# Patient Record
Sex: Female | Born: 1980
Health system: Southern US, Community
[De-identification: ages and names within clinical notes are randomized; demographics above are authoritative.]

## PROBLEM LIST (undated history)

## (undated) DIAGNOSIS — R7303 Prediabetes: Secondary | ICD-10-CM

## (undated) DIAGNOSIS — I493 Ventricular premature depolarization: Secondary | ICD-10-CM

## (undated) DIAGNOSIS — G43909 Migraine, unspecified, not intractable, without status migrainosus: Secondary | ICD-10-CM

## (undated) DIAGNOSIS — K760 Fatty (change of) liver, not elsewhere classified: Secondary | ICD-10-CM

## (undated) DIAGNOSIS — I10 Essential (primary) hypertension: Secondary | ICD-10-CM

## (undated) DIAGNOSIS — K743 Primary biliary cirrhosis: Secondary | ICD-10-CM

## (undated) DIAGNOSIS — M1990 Unspecified osteoarthritis, unspecified site: Secondary | ICD-10-CM

## (undated) HISTORY — DX: Prediabetes: R73.03

## (undated) HISTORY — DX: Primary biliary cirrhosis: K74.3

## (undated) HISTORY — DX: Migraine, unspecified, not intractable, without status migrainosus: G43.909

## (undated) HISTORY — DX: Fatty (change of) liver, not elsewhere classified: K76.0

## (undated) HISTORY — DX: Unspecified osteoarthritis, unspecified site: M19.90

## (undated) HISTORY — DX: Ventricular premature depolarization: I49.3

---

## 2001-02-27 ENCOUNTER — Other Ambulatory Visit: Admission: RE | Admit: 2001-02-27 | Discharge: 2001-02-27 | Payer: Self-pay | Admitting: Obstetrics and Gynecology

## 2001-08-29 ENCOUNTER — Inpatient Hospital Stay (HOSPITAL_COMMUNITY): Admission: AD | Admit: 2001-08-29 | Discharge: 2001-08-31 | Payer: Self-pay | Admitting: Obstetrics and Gynecology

## 2002-07-21 ENCOUNTER — Other Ambulatory Visit: Admission: RE | Admit: 2002-07-21 | Discharge: 2002-07-21 | Payer: Self-pay | Admitting: Obstetrics and Gynecology

## 2002-11-22 ENCOUNTER — Inpatient Hospital Stay (HOSPITAL_COMMUNITY): Admission: AD | Admit: 2002-11-22 | Discharge: 2002-11-22 | Payer: Self-pay | Admitting: Obstetrics & Gynecology

## 2002-11-23 ENCOUNTER — Inpatient Hospital Stay (HOSPITAL_COMMUNITY): Admission: AD | Admit: 2002-11-23 | Discharge: 2002-11-26 | Payer: Self-pay | Admitting: Obstetrics and Gynecology

## 2006-09-03 ENCOUNTER — Inpatient Hospital Stay (HOSPITAL_COMMUNITY): Admission: AD | Admit: 2006-09-03 | Discharge: 2006-09-03 | Payer: Self-pay | Admitting: Obstetrics & Gynecology

## 2006-09-03 HISTORY — PX: TUBAL LIGATION: SHX77

## 2006-09-16 ENCOUNTER — Inpatient Hospital Stay (HOSPITAL_COMMUNITY): Admission: AD | Admit: 2006-09-16 | Discharge: 2006-09-18 | Payer: Self-pay | Admitting: Obstetrics & Gynecology

## 2011-11-07 ENCOUNTER — Ambulatory Visit: Payer: Self-pay | Admitting: Internal Medicine

## 2011-11-07 DIAGNOSIS — Z0289 Encounter for other administrative examinations: Secondary | ICD-10-CM

## 2012-12-02 ENCOUNTER — Other Ambulatory Visit (HOSPITAL_COMMUNITY)
Admission: RE | Admit: 2012-12-02 | Discharge: 2012-12-02 | Disposition: A | Discharge status: Home / Self Care | Payer: 59 | Source: Ambulatory Visit | Attending: Family Medicine | Admitting: Family Medicine

## 2012-12-02 ENCOUNTER — Other Ambulatory Visit: Payer: Self-pay | Admitting: Family Medicine

## 2012-12-02 DIAGNOSIS — Z01419 Encounter for gynecological examination (general) (routine) without abnormal findings: Secondary | ICD-10-CM | POA: Insufficient documentation

## 2013-05-21 ENCOUNTER — Emergency Department (HOSPITAL_COMMUNITY)
Admission: EM | Admit: 2013-05-21 | Discharge: 2013-05-21 | Disposition: A | Discharge status: Home / Self Care | Payer: No Typology Code available for payment source | Attending: Emergency Medicine | Admitting: Emergency Medicine

## 2013-05-21 ENCOUNTER — Encounter (HOSPITAL_COMMUNITY): Payer: Self-pay | Admitting: Emergency Medicine

## 2013-05-21 ENCOUNTER — Emergency Department (HOSPITAL_COMMUNITY): Payer: No Typology Code available for payment source

## 2013-05-21 DIAGNOSIS — Y9389 Activity, other specified: Secondary | ICD-10-CM | POA: Insufficient documentation

## 2013-05-21 DIAGNOSIS — M25512 Pain in left shoulder: Secondary | ICD-10-CM

## 2013-05-21 DIAGNOSIS — Z79899 Other long term (current) drug therapy: Secondary | ICD-10-CM | POA: Insufficient documentation

## 2013-05-21 DIAGNOSIS — S46909A Unspecified injury of unspecified muscle, fascia and tendon at shoulder and upper arm level, unspecified arm, initial encounter: Secondary | ICD-10-CM | POA: Insufficient documentation

## 2013-05-21 DIAGNOSIS — M79622 Pain in left upper arm: Secondary | ICD-10-CM

## 2013-05-21 DIAGNOSIS — Y92410 Unspecified street and highway as the place of occurrence of the external cause: Secondary | ICD-10-CM | POA: Insufficient documentation

## 2013-05-21 DIAGNOSIS — I10 Essential (primary) hypertension: Secondary | ICD-10-CM | POA: Insufficient documentation

## 2013-05-21 DIAGNOSIS — S4980XA Other specified injuries of shoulder and upper arm, unspecified arm, initial encounter: Secondary | ICD-10-CM | POA: Insufficient documentation

## 2013-05-21 HISTORY — DX: Essential (primary) hypertension: I10

## 2013-05-21 MED ORDER — IBUPROFEN 600 MG PO TABS: 600.0000 mg | ORAL_TABLET | Freq: Four times a day (QID) | ORAL | Status: DC | PRN

## 2013-05-21 MED ORDER — DIAZEPAM 5 MG PO TABS: 5.0000 mg | ORAL_TABLET | Freq: Two times a day (BID) | ORAL | Status: DC
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
## 2013-05-24 NOTE — ED Provider Notes (Signed)
History/physical exam/procedure(s) were performed by non-physician practitioner and as supervising physician I was immediately available for consultation/collaboration. I have reviewed all notes and am in agreement with care and plan.   Hilario Quarry, MD 05/24/13 1500

## 2015-07-18 ENCOUNTER — Encounter (INDEPENDENT_AMBULATORY_CARE_PROVIDER_SITE_OTHER): Payer: Self-pay

## 2015-07-18 ENCOUNTER — Ambulatory Visit (INDEPENDENT_AMBULATORY_CARE_PROVIDER_SITE_OTHER): Payer: 59 | Admitting: Family

## 2015-07-18 ENCOUNTER — Encounter: Payer: Self-pay | Admitting: Family

## 2015-07-18 VITALS — BP 117/82 | HR 100 | Resp 16 | Ht 62.0 in | Wt 151.2 lb

## 2015-07-18 DIAGNOSIS — G43809 Other migraine, not intractable, without status migrainosus: Secondary | ICD-10-CM

## 2015-07-18 DIAGNOSIS — G43909 Migraine, unspecified, not intractable, without status migrainosus: Secondary | ICD-10-CM | POA: Insufficient documentation

## 2015-07-18 DIAGNOSIS — I10 Essential (primary) hypertension: Secondary | ICD-10-CM | POA: Insufficient documentation

## 2015-07-18 DIAGNOSIS — E785 Hyperlipidemia, unspecified: Secondary | ICD-10-CM

## 2015-07-18 MED ORDER — AMITRIPTYLINE HCL 10 MG PO TABS: 10.0000 mg | ORAL_TABLET | Freq: Every day | ORAL | Status: DC

## 2015-07-18 MED ORDER — SUMATRIPTAN SUCCINATE 50 MG PO TABS: ORAL_TABLET | ORAL | Status: DC

## 2015-07-18 NOTE — Assessment & Plan Note (Signed)
Tolerating statin. Plan to obtain flp next visit.

## 2015-07-18 NOTE — Patient Instructions (Signed)
Stop Excedrin migraine. Start elavil once daily at bedtime. You may use imitrex as needed for migraine (max 2 tabs in 24 hours).

## 2015-07-18 NOTE — Assessment & Plan Note (Signed)
Uncontrolled.  Advised pt as follows:  Stop Excedrin migraine. Start elavil once daily at bedtime. You may use imitrex as needed for migraine (max 2 tabs in 24 hours).

## 2015-07-18 NOTE — Progress Notes (Signed)
Pre visit review using our clinic review tool, if applicable. No additional management support is needed unless otherwise documented below in the visit note. 

## 2015-07-18 NOTE — Assessment & Plan Note (Signed)
BP Readings from Last 3 Encounters:  07/18/15 117/82  05/21/13 116/86   BP stable on current meds.  Plan bmet next visit.

## 2015-07-18 NOTE — Progress Notes (Signed)
Subjective:    Patient ID: Alyssa Berry, female    DOB: [birth year], 34 y.o.   MRN: [id number]  HPI   Ms. Alyssa Berry is a 34 yr old female who presents today to establish care. She presents with chief complaint of headache. Pmhx is significant for migraine. Reports that excedrin helps some but ends up taking 4 tabs in < 8 hr but HA still lingers. She reports HA 3-4 times a week.  Lasts 1 day. Denies nausea but does develop photophobia.  + phonophobia. Generally 8-9/10.  Reports minimal caffiene intake.  She has followed at [facility] with Darral Dash.    Pmhx is also significant for the following:   HTN- currently maintained on amlodipine, hctz, and diovan.    Hyperlipidemia- on lipitor.  Reports that her cholesterol was fair on lipitor.  Was increased 6 months ago from  to .    Arthritis-  Review of Systems  Constitutional: Negative for unexpected weight change.  HENT: Negative for hearing loss and rhinorrhea.   Eyes: Negative for visual disturbance.  Respiratory: Negative for cough and shortness of breath.   Cardiovascular: Negative for chest pain and leg swelling.  Gastrointestinal: Negative for nausea, vomiting and diarrhea.  Genitourinary: Negative for dysuria, frequency and menstrual problem.  Musculoskeletal:       Reports mild arthritis pain in left knee due to an old injury  Skin: Negative for rash.  Neurological: Positive for headaches.  Hematological: Negative for adenopathy.  Psychiatric/Behavioral:       Denies depression/anxiety     Past Medical History  Diagnosis Date  . Hypertension   . Arthritis   . Hyperlipidemia   . Migraine     Social History   Social History  . Marital Status: Married    Spouse Name: N/A  . Number of Children: N/A  . Years of Education: N/A   Occupational History  . Not on file.   Social History Main Topics  . Smoking status: Never Smoker   . Smokeless tobacco: Not on file  . Alcohol Use: No  . Drug Use: Not on file    . Sexual Activity: Not on file   Other Topics Concern  . 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 Tubal ligation  2008    Family History  Problem Relation Age of Onset  . Hyperlipidemia Mother   . Hypertension Mother   . Cancer Maternal Grandmother     lung  . Hyperlipidemia Maternal Grandmother   . Hypertension Maternal Grandmother   . Hyperlipidemia Maternal Grandfather     No Known Allergies  Current Outpatient Prescriptions on File Prior to Visit  Medication Sig Dispense Refill  . ibuprofen (ADVIL,MOTRIN) 600 MG tablet Take 1 tablet (600 mg total) by mouth every 6 (six) hours as needed for pain. 30 tablet 0   No current facility-administered medications on file prior to visit.    BP 117/82 mmHg  Pulse 100  Resp 16  Ht  (1.575 m)  SpO2 100%     Objective:   Physical Exam  Physical Exam  Constitutional: She is oriented to person, place, and time. She appears well-developed and well-nourished. No distress.  HENT:  Head: Normocephalic and atraumatic.  Right Ear: Tympanic membrane and ear canal normal.  Left Ear: Tympanic membrane and ear canal normal.  Mouth/Throat: Oropharynx is clear and moist.  Eyes: Pupils are equal, round, and reactive to light. No scleral icterus.  Neck: Normal range of motion. No thyromegaly present.  Cardiovascular: Normal rate and regular rhythm.   No murmur heard. Pulmonary/Chest: Effort normal and breath sounds normal. No respiratory distress. He has no wheezes. She has no rales. She exhibits no tenderness.  Abdominal: Soft. Bowel sounds are normal. He exhibits no distension and no mass. There is no tenderness. There is no rebound and no guarding.  Musculoskeletal: She exhibits no edema.  Lymphadenopathy:    She  has no cervical adenopathy.  Neurological: She is alert and oriented to person, place, and time. She has normal patellar reflexes. She exhibits normal muscle tone. Coordination normal.  Skin: Skin is warm and dry.  Psychiatric: She has a normal mood and affect. Her behavior is normal. Judgment and thought content normal.       Assessment & Plan:         Assessment & Plan:

## 2015-08-17 ENCOUNTER — Ambulatory Visit: Payer: Self-pay | Admitting: Family

## 2015-08-18 ENCOUNTER — Encounter: Payer: Self-pay | Admitting: Family

## 2015-08-19 MED ORDER — VALSARTAN 160 MG PO TABS: 160.0000 mg | ORAL_TABLET | Freq: Every day | ORAL | Status: DC

## 2015-08-19 MED ORDER — HYDROCHLOROTHIAZIDE 12.5 MG PO CAPS
12.5000 mg | ORAL_CAPSULE | Freq: Every day | ORAL | Status: DC
Start: 2015-08-19 — End: 2016-02-28

## 2015-08-19 MED ORDER — AMLODIPINE BESYLATE 5 MG PO TABS: 5.0000 mg | ORAL_TABLET | Freq: Every day | ORAL | Status: DC

## 2015-09-05 DIAGNOSIS — H5213 Myopia, bilateral: Secondary | ICD-10-CM | POA: Diagnosis not present

## 2015-09-07 ENCOUNTER — Ambulatory Visit (INDEPENDENT_AMBULATORY_CARE_PROVIDER_SITE_OTHER): Payer: 59 | Admitting: Family

## 2015-09-07 ENCOUNTER — Encounter: Payer: Self-pay | Admitting: Family

## 2015-09-07 VITALS — BP 109/77 | HR 91 | Temp 98.5°F | Resp 16 | Ht 62.0 in | Wt 152.4 lb

## 2015-09-07 DIAGNOSIS — E785 Hyperlipidemia, unspecified: Secondary | ICD-10-CM | POA: Diagnosis not present

## 2015-09-07 DIAGNOSIS — G43809 Other migraine, not intractable, without status migrainosus: Secondary | ICD-10-CM

## 2015-09-07 DIAGNOSIS — I10 Essential (primary) hypertension: Secondary | ICD-10-CM | POA: Diagnosis not present

## 2015-09-07 LAB — LIPID PANEL
CHOL/HDL RATIO: 4
Cholesterol: 163 mg/dL (ref 0–200)
HDL: 44.6 mg/dL (ref >39.00)
LDL CALC: 91 mg/dL (ref 0–99)
NonHDL: 118.71
TRIGLYCERIDES: 139 mg/dL (ref 0.0–149.0)
VLDL: 27.8 mg/dL (ref 0.0–40.0)

## 2015-09-07 LAB — BASIC METABOLIC PANEL
BUN: 9 mg/dL (ref 6–23)
CHLORIDE: 101 meq/L (ref 96–112)
CO2: 27 meq/L (ref 19–32)
CREATININE: 0.59 mg/dL (ref 0.40–1.20)
Calcium: 8.8 mg/dL (ref 8.4–10.5)
GFR: 123.44 mL/min (ref >60.00)
Glucose, Bld: 98 mg/dL (ref 70–99)
Potassium: 3.4 mEq/L — ABNORMAL LOW (ref 3.5–5.1)
Sodium: 139 mEq/L (ref 135–145)

## 2015-09-07 MED ORDER — AMITRIPTYLINE HCL 10 MG PO TABS: 10.0000 mg | ORAL_TABLET | Freq: Every day | ORAL | Status: DC

## 2015-09-07 NOTE — Progress Notes (Signed)
Subjective:    Patient ID: Alyssa Berry, female    DOB: 1981-04-21, 35 y.o.   MRN: [id number]  HPI  Alyssa Berry is a 35 yr old female who presents today for follow up.  1) Migraines-  Last visit she was instructed to d/c excedrin, start elavil.  Use imitrex prn.  She reports 3-4 migraines a week previously,  now down to 3-4 this last month.  Has used imitrex twice.    2) HTN- continues amlodipine and diovan.  BP Readings from Last 3 Encounters:  09/07/15 109/77  07/18/15 117/82  05/21/13 116/86   3)  Hyperlipidemia- maintained on statin.    Review of Systems    see HPI  Past Medical History  Diagnosis Date  . Hypertension   . Arthritis   . Hyperlipidemia   . Migraine     Social History   Social History  . Marital Status: Married    Spouse Name: N/A  . Number of Children: N/A  . Years of Education: N/A   Occupational History  . Not on file.   Social History Main Topics  . Smoking status: Never Smoker   . Smokeless tobacco: Not on file  . Alcohol Use: No  . Drug Use: Not on file  . Sexual Activity: Not on file   Other Topics Concern  . Not on file   Social History Narrative   Married   3 Children   2002- daughter Otho KetKaityln   2004- Cathryn   2008Alphonzo Lemmings- Vegeta   C[facility] at [employer]   Completed associates degree   Enjoys drawing    Past Surgical History  Procedure Laterality Date  . Tubal ligation  2008    Family History  Problem Relation Age of Onset  . Hyperlipidemia Mother   . Hypertension Mother   . Cancer Maternal Grandmother     lung  . Hyperlipidemia Maternal Grandmother   . Hypertension Maternal Grandmother   . Hyperlipidemia Maternal Grandfather     No Known Allergies  Current Outpatient Prescriptions on File Prior to Visit  Medication Sig Dispense Refill  . amitriptyline (ELAVIL) 10 MG tablet Take 1 tablet (10 mg total) by mouth at bedtime. 30 tablet 2  . amLODipine (NORVASC) 5 MG tablet Take 1 tablet (5 mg total) by mouth  daily. 90 tablet 1  . atorvastatin (LIPITOR) 40 MG tablet Take 40 mg by mouth daily.  0  . hydrochlorothiazide (MICROZIDE) 12.5 MG capsule Take 1 capsule (12.5 mg total) by mouth daily. 90 capsule 1  . ibuprofen (ADVIL,MOTRIN) 600 MG tablet Take 1 tablet (600 mg total) by mouth every 6 (six) hours as needed for pain. 30 tablet 0  . SUMAtriptan (IMITREX) 50 MG tablet 1 tab at start of migraine may repeat in 2 hours if needed.  Max 2 tabs/24 hours 10 tablet 5  . valsartan (DIOVAN) 160 MG tablet Take 1 tablet (160 mg total) by mouth daily. 90 tablet 1   No current facility-administered medications on file prior to visit.    BP 109/77 mmHg  Pulse 91  Temp(Src) 98.5 F (36.9 C) (Oral)  Resp 16  Ht 5\' 2"  (1.575 m)  Wt 152 lb 6.4 oz (69.128 kg)  BMI 27.87 kg/m2  SpO2 100%  LMP 07/18/2015    Objective:   Physical Exam  Constitutional: She is oriented to person, place, and time. She appears well-developed and well-nourished.  HENT:  Head: Normocephalic and atraumatic.  Cardiovascular: Normal rate, regular rhythm and normal heart  sounds.   No murmur heard. Pulmonary/Chest: Effort normal and breath sounds normal. No respiratory distress. She has no wheezes.  Musculoskeletal: She exhibits no edema.  Neurological: She is alert and oriented to person, place, and time.  Psychiatric: She has a normal mood and affect. Her behavior is normal. Judgment and thought content normal.          Assessment & Plan:

## 2015-09-07 NOTE — Assessment & Plan Note (Signed)
BP stable, obtain bmet.  

## 2015-09-07 NOTE — Patient Instructions (Addendum)
Please complete lab work prior to leaving. Schedule a complete physical at your convenience.  

## 2015-09-07 NOTE — Progress Notes (Signed)
Pre visit review using our clinic review tool, if applicable. No additional management support is needed unless otherwise documented below in the visit note. 

## 2015-09-07 NOTE — Assessment & Plan Note (Signed)
Tolerating statin, obtain lipid panel.  

## 2015-09-07 NOTE — Assessment & Plan Note (Signed)
Frequency of migraines has improved. Continue elavil and prn imitrex.

## 2015-09-08 ENCOUNTER — Telehealth: Payer: Self-pay | Admitting: Family

## 2015-09-08 DIAGNOSIS — E876 Hypokalemia: Secondary | ICD-10-CM

## 2015-09-08 MED ORDER — POTASSIUM CHLORIDE CRYS ER 20 MEQ PO TBCR: 20.0000 meq | EXTENDED_RELEASE_TABLET | Freq: Every day | ORAL | Status: DC

## 2015-09-08 MED FILL — KLOR-CON M20 TABLET: 20 | 30 days supply | Qty: 30 | Fill #0

## 2015-09-08 NOTE — Telephone Encounter (Signed)
Potassium is low.  Add kur once daily, repeat bmet in 1 week, dx hypokalemia.

## 2015-09-08 NOTE — Telephone Encounter (Signed)
Left a message for call back.  

## 2015-09-09 NOTE — Telephone Encounter (Signed)
Notified pt and she voices understanding. Lab order entered and lab appt scheduled [facility].

## 2015-09-16 ENCOUNTER — Other Ambulatory Visit (INDEPENDENT_AMBULATORY_CARE_PROVIDER_SITE_OTHER): Payer: 59

## 2015-09-16 ENCOUNTER — Encounter: Payer: Self-pay | Admitting: Family

## 2015-09-16 DIAGNOSIS — E876 Hypokalemia: Secondary | ICD-10-CM

## 2015-09-16 LAB — BASIC METABOLIC PANEL
BUN: 13 mg/dL (ref 6–23)
CO2: 27 meq/L (ref 19–32)
Calcium: 9.4 mg/dL (ref 8.4–10.5)
Chloride: 100 mEq/L (ref 96–112)
Creatinine, Ser: 0.6 mg/dL (ref 0.40–1.20)
GFR: 121.05 mL/min (ref >60.00)
GLUCOSE: 101 mg/dL — AB (ref 70–99)
POTASSIUM: 4 meq/L (ref 3.5–5.1)
SODIUM: 136 meq/L (ref 135–145)

## 2015-09-20 MED FILL — AMITRIPTYLINE HCL 10 MG TAB: 10 | 30 days supply | Qty: 30 | Fill #2

## 2015-09-28 ENCOUNTER — Encounter: Payer: Self-pay | Admitting: Family

## 2015-09-28 MED ORDER — ATORVASTATIN CALCIUM 40 MG PO TABS
40.0000 mg | ORAL_TABLET | Freq: Every day | ORAL | Status: DC
Start: 2015-09-28 — End: 2016-05-28

## 2015-09-28 MED FILL — ATORVASTATIN 40 MG TABLET: 40 | 90 days supply | Qty: 90 | Fill #0

## 2015-10-06 MED FILL — POTASSIUM CL ER 20 MEQ TAB: 20 | 30 days supply | Qty: 30 | Fill #1

## 2015-10-18 MED FILL — AMITRIPTYLINE HCL 10 MG TAB: 10 | 90 days supply | Qty: 90 | Fill #0

## 2015-11-27 MED FILL — VALSARTAN 160 MG TABLET: 160 | 90 days supply | Qty: 90 | Fill #1

## 2015-11-27 MED FILL — AMLODIPINE BESYLATE 5 MG TA: 5 | 90 days supply | Qty: 90 | Fill #1

## 2015-11-28 MED FILL — HYDROCHLOROTHIAZIDE 12.5 MG: 12.5 | 90 days supply | Qty: 90 | Fill #1

## 2015-11-28 MED FILL — KLOR-CON M20 TABLET: 20 | 30 days supply | Qty: 30 | Fill #2

## 2015-12-27 MED FILL — ATORVASTATIN 40 MG TABLET: 40 | 90 days supply | Qty: 90 | Fill #1

## 2015-12-27 MED FILL — KLOR-CON M20 TABLET: 20 | 30 days supply | Qty: 30 | Fill #3

## 2016-01-09 ENCOUNTER — Telehealth: Payer: Self-pay | Admitting: Behavioral Health

## 2016-01-09 NOTE — Telephone Encounter (Signed)
Unable to reach patient at time of Pre-Visit Call.  Left message for patient to return call when available.    

## 2016-01-10 ENCOUNTER — Telehealth: Payer: Self-pay | Admitting: Family

## 2016-01-11 ENCOUNTER — Encounter: Payer: 59 | Admitting: Family

## 2016-01-11 MED FILL — AMITRIPTYLINE HCL 10 MG TAB: 10 | 90 days supply | Qty: 90 | Fill #1

## 2016-01-16 ENCOUNTER — Encounter: Payer: Self-pay | Admitting: Family

## 2016-02-20 ENCOUNTER — Encounter: Payer: Self-pay | Admitting: Family

## 2016-02-22 ENCOUNTER — Encounter: Payer: Self-pay | Admitting: Family

## 2016-02-27 ENCOUNTER — Encounter: Payer: Self-pay | Admitting: Family

## 2016-02-28 MED ORDER — AMLODIPINE BESYLATE 5 MG PO TABS: 5.0000 mg | ORAL_TABLET | Freq: Every day | ORAL | Status: DC

## 2016-02-28 MED ORDER — VALSARTAN 160 MG PO TABS: 160.0000 mg | ORAL_TABLET | Freq: Every day | ORAL | Status: DC

## 2016-02-28 MED ORDER — HYDROCHLOROTHIAZIDE 12.5 MG PO CAPS: 12.5000 mg | ORAL_CAPSULE | Freq: Every day | ORAL | Status: DC

## 2016-02-28 MED FILL — SIMVASTATIN 40 MG TABLET: 40 | 90 days supply | Qty: 90 | Fill #0

## 2016-02-28 MED FILL — VALSARTAN 160 MG TABLET: 160 | 90 days supply | Qty: 90 | Fill #0

## 2016-02-28 MED FILL — AMLODIPINE BESYLATE 5 MG TA: 5 | 90 days supply | Qty: 90 | Fill #0

## 2016-02-28 MED FILL — SUMATRIPTAN SUCC 50 MG TAB: 50 | 30 days supply | Qty: 9 | Fill #1

## 2016-02-28 MED FILL — HYDROCHLOROTHIAZIDE 12.5 MG: 12.5 | 90 days supply | Qty: 90 | Fill #0

## 2016-03-05 ENCOUNTER — Encounter: Payer: Self-pay | Admitting: Family

## 2016-03-21 NOTE — Telephone Encounter (Signed)
Complete

## 2016-04-17 ENCOUNTER — Encounter: Payer: Self-pay | Admitting: Family

## 2016-04-18 MED ORDER — AMITRIPTYLINE HCL 10 MG PO TABS: 10.0000 mg | ORAL_TABLET | Freq: Every day | ORAL | 1 refills | Status: DC

## 2016-04-18 MED FILL — AMITRIPTYLINE HCL 10 MG TAB: 10 | 90 days supply | Qty: 90 | Fill #0

## 2016-05-08 ENCOUNTER — Encounter: Payer: Self-pay | Admitting: Family

## 2016-05-08 ENCOUNTER — Ambulatory Visit (INDEPENDENT_AMBULATORY_CARE_PROVIDER_SITE_OTHER): Payer: 59 | Admitting: Family

## 2016-05-08 VITALS — BP 111/82 | HR 83 | Temp 98.4°F | Resp 16 | Ht 61.25 in | Wt 151.6 lb

## 2016-05-08 DIAGNOSIS — Z0000 Encounter for general adult medical examination without abnormal findings: Secondary | ICD-10-CM

## 2016-05-08 DIAGNOSIS — G43809 Other migraine, not intractable, without status migrainosus: Secondary | ICD-10-CM

## 2016-05-08 DIAGNOSIS — E785 Hyperlipidemia, unspecified: Secondary | ICD-10-CM

## 2016-05-08 LAB — CBC WITH DIFFERENTIAL/PLATELET
BASOS PCT: 0.4 % (ref 0.0–3.0)
Basophils Absolute: 0 10*3/uL (ref 0.0–0.1)
EOS ABS: 0.3 10*3/uL (ref 0.0–0.7)
EOS PCT: 4.1 % (ref 0.0–5.0)
HEMATOCRIT: 38 % (ref 36.0–46.0)
HEMOGLOBIN: 13 g/dL (ref 12.0–15.0)
Lymphocytes Relative: 24.5 % (ref 12.0–46.0)
Lymphs Abs: 2 10*3/uL (ref 0.7–4.0)
MCHC: 34.1 g/dL (ref 30.0–36.0)
MCV: 87.1 fl (ref 78.0–100.0)
MONOS PCT: 6.9 % (ref 3.0–12.0)
Monocytes Absolute: 0.6 10*3/uL (ref 0.1–1.0)
Neutro Abs: 5.1 10*3/uL (ref 1.4–7.7)
Neutrophils Relative %: 64.1 % (ref 43.0–77.0)
Platelets: 313 10*3/uL (ref 150.0–400.0)
RBC: 4.37 Mil/uL (ref 3.87–5.11)
RDW: 13.2 % (ref 11.5–15.5)
WBC: 8 10*3/uL (ref 4.0–10.5)

## 2016-05-08 LAB — BASIC METABOLIC PANEL
BUN: 15 mg/dL (ref 6–23)
CHLORIDE: 100 meq/L (ref 96–112)
CO2: 26 meq/L (ref 19–32)
CREATININE: 0.62 mg/dL (ref 0.40–1.20)
Calcium: 9 mg/dL (ref 8.4–10.5)
GFR: 116.12 mL/min (ref >60.00)
GLUCOSE: 95 mg/dL (ref 70–99)
POTASSIUM: 3.6 meq/L (ref 3.5–5.1)
Sodium: 134 mEq/L — ABNORMAL LOW (ref 135–145)

## 2016-05-08 LAB — URINALYSIS, ROUTINE W REFLEX MICROSCOPIC
BILIRUBIN URINE: NEGATIVE
HGB URINE DIPSTICK: NEGATIVE
Ketones, ur: NEGATIVE
Leukocytes, UA: NEGATIVE
NITRITE: NEGATIVE
RBC / HPF: NONE SEEN (ref 0–?)
Specific Gravity, Urine: 1.01 (ref 1.000–1.030)
TOTAL PROTEIN, URINE-UPE24: NEGATIVE
URINE GLUCOSE: NEGATIVE
Urobilinogen, UA: 0.2 (ref 0.0–1.0)
pH: 6.5 (ref 5.0–8.0)

## 2016-05-08 LAB — TSH: TSH: 1.86 u[IU]/mL (ref 0.35–4.50)

## 2016-05-08 MED ORDER — POTASSIUM CHLORIDE CRYS ER 20 MEQ PO TBCR: 20.0000 meq | EXTENDED_RELEASE_TABLET | Freq: Every day | ORAL | 5 refills | Status: DC

## 2016-05-08 MED ORDER — AMITRIPTYLINE HCL 25 MG PO TABS: 25.0000 mg | ORAL_TABLET | Freq: Every day | ORAL | 3 refills | Status: DC

## 2016-05-08 MED FILL — KLOR-CON M20 TABLET: 20 | 30 days supply | Qty: 30 | Fill #0

## 2016-05-08 MED FILL — AMITRIPTYLINE HCL 25 MG TAB: 25 | 30 days supply | Qty: 30 | Fill #0

## 2016-05-08 NOTE — Progress Notes (Signed)
Subjective:    Patient ID: Alyssa Berry, female    DOB: 03-07-1981, 35 y.o.   MRN: [id number]  HPI  Patient presents today for complete physical.  Immunizations: due for flu shot, Tdap up to date.  Will do flu shot at work.  Diet: reports that her diet is fair.  Trying to improve. Exercise: walks every weekend with her kids/dogs Pap Smear: 4/14- has apt with [facility] 07/02/16 Dental: up to date Vision:  Up date  She ran out of potassium 3 weeks ago.  Review of Systems  Constitutional: Negative for unexpected weight change.  HENT: Negative for hearing loss and rhinorrhea.   Eyes: Negative for visual disturbance.  Respiratory: Negative for cough.   Cardiovascular: Negative for leg swelling.  Gastrointestinal: Negative for constipation and diarrhea.  Genitourinary: Negative for dysuria and frequency.       Periods irregular- "always" last 3 days  Musculoskeletal: Negative for arthralgias and myalgias.  Skin: Negative for rash.  Neurological:       3 headaches a week, occasional migraines  Hematological: Negative for adenopathy.  Psychiatric/Behavioral:       Denies depression/anxiety       Past Medical History:  Diagnosis Date  . Arthritis   . Hyperlipidemia   . Hypertension   . Migraine      Social History   Social History  . Marital status: Married    Spouse name: N/A  . Number of children: N/A  . Years of education: N/A   Occupational History  . Not on file.   Social History Main Topics  . Smoking status: Never Smoker  . Smokeless tobacco: Not on file  . Alcohol use No  . Drug use: No  . Sexual activity: Not on file   Other Topics Concern  . 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 TUBAL  LIGATION  2008    Family History  Problem Relation Age of Onset  . Hyperlipidemia Mother   . Hypertension Mother   . Cancer Maternal Grandmother     lung  . Hyperlipidemia Maternal Grandmother   . Hypertension Maternal Grandmother   . Hyperlipidemia Maternal Grandfather     No Known Allergies  Current Outpatient Prescriptions on File Prior to Visit  Medication Sig Dispense Refill  . amLODipine (NORVASC) 5 MG tablet Take 1 tablet (5 mg total) by mouth daily. 90 tablet 0  . atorvastatin (LIPITOR) 40 MG tablet Take 1 tablet (40 mg total) by mouth daily. 90 tablet 1  . hydrochlorothiazide (MICROZIDE) 12.5 MG capsule Take 1 capsule (12.5 mg total) by mouth daily. 90 capsule 0  . ibuprofen (ADVIL,MOTRIN) 600 MG tablet Take 1 tablet (600 mg total) by mouth every 6 (six) hours as needed for pain. 30 tablet 0  . SUMAtriptan (IMITREX) 50 MG tablet 1 tab at start of migraine may repeat in 2 hours if needed.  Max 2 tabs/24 hours 10 tablet 5  . valsartan (DIOVAN) 160 MG tablet Take 1 tablet (160 mg total) by mouth daily. 90 tablet 0  . potassium chloride SA (K-DUR,KLOR-CON) 20 MEQ tablet Take 1 tablet (20 mEq total) by mouth daily. (Patient not taking: Reported on 05/08/2016)  30 tablet 3   No current facility-administered medications on file prior to visit.     BP 111/82 (BP Location: Right Arm, Cuff Size: Normal)   Pulse 83   Temp 98.4 F (36.9 C) (Oral)   Resp 16   Ht 5' 1.25" (1.556 m)   Wt 151 lb 9.6 oz (68.8 kg)   LMP 03/07/2016   SpO2 100% Comment: room air  BMI 28.41 kg/m    Objective:   Physical Exam Physical Exam  Constitutional: She is oriented to person, place, and time. She appears well-developed and well-nourished. No distress.  HENT:  Head: Normocephalic and atraumatic.  Right Ear: Tympanic membrane and ear canal normal.  Left Ear: Tympanic membrane and ear canal normal.  Mouth/Throat: Oropharynx is clear and moist.  Eyes: Pupils are equal, round, and reactive to  light. No scleral icterus.  Neck: Normal range of motion. No thyromegaly present.  Cardiovascular: Normal rate and regular rhythm.   No murmur heard. Pulmonary/Chest: Effort normal and breath sounds normal. No respiratory distress. He has no wheezes. She has no rales. She exhibits no tenderness.  Abdominal: Soft. Bowel sounds are normal. She exhibits no distension and no mass. There is no tenderness. There is no rebound and no guarding.  Musculoskeletal: She exhibits no edema.  Lymphadenopathy:    She has no cervical adenopathy.  Neurological: She is alert and oriented to person, place, and time. She has normal patellar reflexes. She exhibits normal muscle tone. Coordination normal.  Skin: Skin is warm and dry.  Psychiatric: She has a normal mood and affect. Her behavior is normal. Judgment and thought content normal.  Breasts: Examined lying Right: Without masses, retractions, discharge or axillary adenopathy.  Left: Without masses, retractions, discharge or axillary adenopathy.  Pelvic:  deferred      Assessment & Plan:          Assessment & Plan:

## 2016-05-08 NOTE — Assessment & Plan Note (Signed)
Headaches are more frequent lately, will try increasing elavil form 10mg  to 25mg  [facility].

## 2016-05-08 NOTE — Patient Instructions (Addendum)
Please complete lab work prior to leaving. Please continue to work on healthy diet, exercise and weight loss.  Try to increase your walking to 30 minutes 5 days a week.  Restart potassium supplement.  Please increase elavil from 10mg  to 25mg  once daily.

## 2016-05-08 NOTE — Progress Notes (Signed)
Pre visit review using our clinic review tool, if applicable. No additional management support is needed unless otherwise documented below in the visit note. 

## 2016-05-08 NOTE — Assessment & Plan Note (Signed)
Discussed healthy diet, exercise, weight loss. Obtain routine lab work. She will obtain flu shot from work.

## 2016-05-10 ENCOUNTER — Encounter: Payer: Self-pay | Admitting: Family

## 2016-05-26 ENCOUNTER — Other Ambulatory Visit: Payer: Self-pay | Admitting: Family

## 2016-05-26 ENCOUNTER — Encounter: Payer: Self-pay | Admitting: Family

## 2016-05-28 MED ORDER — HYDROCHLOROTHIAZIDE 12.5 MG PO CAPS: 12.5000 mg | ORAL_CAPSULE | Freq: Every day | ORAL | 1 refills | Status: DC

## 2016-05-28 MED ORDER — AMLODIPINE BESYLATE 5 MG PO TABS: 5.0000 mg | ORAL_TABLET | Freq: Every day | ORAL | 1 refills | Status: DC

## 2016-05-28 MED ORDER — ATORVASTATIN CALCIUM 40 MG PO TABS: 40.0000 mg | ORAL_TABLET | Freq: Every day | ORAL | 1 refills | Status: DC

## 2016-05-28 MED ORDER — VALSARTAN 160 MG PO TABS: 160.0000 mg | ORAL_TABLET | Freq: Every day | ORAL | 1 refills | Status: DC

## 2016-05-28 MED FILL — HYDROCHLOROTHIAZIDE 12.5 MG: 12.5 | 90 days supply | Qty: 90 | Fill #0

## 2016-05-28 MED FILL — VALSARTAN 160 MG TABLET: 160 | 90 days supply | Qty: 90 | Fill #0

## 2016-05-28 MED FILL — AMLODIPINE BESYLATE 5 MG TA: 5 | 90 days supply | Qty: 90 | Fill #0

## 2016-05-28 MED FILL — ATORVASTATIN 40 MG TABLET: 40 | 90 days supply | Qty: 90 | Fill #0

## 2016-06-06 ENCOUNTER — Encounter: Payer: Self-pay | Admitting: Family

## 2016-06-14 MED FILL — AMITRIPTYLINE HCL 25 MG TAB: 25 | 30 days supply | Qty: 30 | Fill #1

## 2016-06-14 MED FILL — KLOR-CON M20 TABLET: 20 | 30 days supply | Qty: 30 | Fill #1

## 2016-07-26 MED FILL — KLOR-CON M20 TABLET: 20 | 30 days supply | Qty: 30 | Fill #2

## 2016-07-26 MED FILL — AMITRIPTYLINE HCL 25 MG TAB: 25 | 30 days supply | Qty: 30 | Fill #2

## 2016-08-02 DIAGNOSIS — F321 Major depressive disorder, single episode, moderate: Secondary | ICD-10-CM | POA: Diagnosis not present

## 2016-08-08 ENCOUNTER — Ambulatory Visit: Payer: Self-pay | Admitting: Family

## 2016-08-13 ENCOUNTER — Encounter (HOSPITAL_COMMUNITY): Payer: Self-pay | Admitting: Emergency Medicine

## 2016-08-13 ENCOUNTER — Emergency Department (HOSPITAL_COMMUNITY): Payer: 59

## 2016-08-13 ENCOUNTER — Emergency Department (HOSPITAL_COMMUNITY)
Admission: EM | Admit: 2016-08-13 | Discharge: 2016-08-13 | Disposition: A | Discharge status: Home / Self Care | Payer: 59 | Attending: Emergency Medicine | Admitting: Emergency Medicine

## 2016-08-13 DIAGNOSIS — Y999 Unspecified external cause status: Secondary | ICD-10-CM | POA: Diagnosis not present

## 2016-08-13 DIAGNOSIS — M791 Myalgia: Secondary | ICD-10-CM | POA: Diagnosis not present

## 2016-08-13 DIAGNOSIS — Y939 Activity, unspecified: Secondary | ICD-10-CM | POA: Insufficient documentation

## 2016-08-13 DIAGNOSIS — R079 Chest pain, unspecified: Secondary | ICD-10-CM | POA: Diagnosis not present

## 2016-08-13 DIAGNOSIS — S0990XA Unspecified injury of head, initial encounter: Secondary | ICD-10-CM | POA: Diagnosis not present

## 2016-08-13 DIAGNOSIS — S4992XA Unspecified injury of left shoulder and upper arm, initial encounter: Secondary | ICD-10-CM | POA: Diagnosis not present

## 2016-08-13 DIAGNOSIS — S79912A Unspecified injury of left hip, initial encounter: Secondary | ICD-10-CM | POA: Diagnosis not present

## 2016-08-13 DIAGNOSIS — M25512 Pain in left shoulder: Secondary | ICD-10-CM

## 2016-08-13 DIAGNOSIS — I10 Essential (primary) hypertension: Secondary | ICD-10-CM | POA: Diagnosis not present

## 2016-08-13 DIAGNOSIS — S299XXA Unspecified injury of thorax, initial encounter: Secondary | ICD-10-CM | POA: Diagnosis not present

## 2016-08-13 DIAGNOSIS — M542 Cervicalgia: Secondary | ICD-10-CM | POA: Diagnosis not present

## 2016-08-13 DIAGNOSIS — R930 Abnormal findings on diagnostic imaging of skull and head, not elsewhere classified: Secondary | ICD-10-CM | POA: Diagnosis not present

## 2016-08-13 DIAGNOSIS — Z79899 Other long term (current) drug therapy: Secondary | ICD-10-CM | POA: Insufficient documentation

## 2016-08-13 DIAGNOSIS — Y92410 Unspecified street and highway as the place of occurrence of the external cause: Secondary | ICD-10-CM | POA: Diagnosis not present

## 2016-08-13 DIAGNOSIS — S199XXA Unspecified injury of neck, initial encounter: Secondary | ICD-10-CM | POA: Diagnosis not present

## 2016-08-13 DIAGNOSIS — M7918 Myalgia, other site: Secondary | ICD-10-CM

## 2016-08-13 MED ORDER — IBUPROFEN 600 MG PO TABS: 600.0000 mg | ORAL_TABLET | Freq: Four times a day (QID) | ORAL | 0 refills | Status: DC | PRN

## 2016-08-13 MED ORDER — OXYCODONE HCL 5 MG PO TABS: 5.0000 mg | ORAL_TABLET | ORAL | 0 refills | Status: DC | PRN
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
## 2016-08-13 NOTE — Progress Notes (Signed)
Orthopedic Tech Progress Note Patient Details:  Alyssa InglesChanthearn Berry 06-01-1981 [id number]  Ortho Devices Type of Ortho Device: Arm sling Ortho Device/Splint Interventions: Application   Saul FordyceJennifer C Paiden Caraveo 08/13/2016, 11:54 AM

## 2016-08-13 NOTE — ED Notes (Signed)
Ortho tech paged for arm sling.

## 2016-08-13 NOTE — ED Notes (Signed)
Patient arrived [facility] on [facility] with c-collar intact; patient placed on continuous pulse oximetry and blood pressure cuff

## 2016-08-14 MED FILL — IBUPROFEN 600 MG TABLET: 600 | 7 days supply | Qty: 30 | Fill #0

## 2016-08-14 MED FILL — oxyCODONE HCL 5 MG TABS: 5 | 1 days supply | Qty: 10 | Fill #0

## 2016-08-15 ENCOUNTER — Encounter: Payer: Self-pay | Admitting: Family

## 2016-08-15 ENCOUNTER — Ambulatory Visit (INDEPENDENT_AMBULATORY_CARE_PROVIDER_SITE_OTHER): Payer: 59 | Admitting: Family

## 2016-08-15 VITALS — BP 118/81 | HR 95 | Temp 98.3°F | Resp 18 | Ht 61.25 in | Wt 150.8 lb

## 2016-08-15 DIAGNOSIS — Z041 Encounter for examination and observation following transport accident: Secondary | ICD-10-CM

## 2016-08-15 DIAGNOSIS — Z043 Encounter for examination and observation following other accident: Secondary | ICD-10-CM | POA: Diagnosis not present

## 2016-08-15 NOTE — Patient Instructions (Signed)
Continue ibuprofen short term then switch to tylenol as needed. You may return to work on Monday.
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
## 2016-08-15 NOTE — Progress Notes (Signed)
Pre visit review using our clinic review tool, if applicable. No additional management support is needed unless otherwise documented below in the visit note. 

## 2016-08-20 ENCOUNTER — Inpatient Hospital Stay: Payer: Self-pay | Admitting: Family

## 2016-08-23 MED FILL — AMLODIPINE BESYLATE 5 MG TA: 5 | 90 days supply | Qty: 90 | Fill #1

## 2016-08-23 MED FILL — KLOR-CON M20 TABLET: 20 | 30 days supply | Qty: 30 | Fill #3

## 2016-08-23 MED FILL — HYDROCHLOROTHIAZIDE 12.5 MG: 12.5 | 90 days supply | Qty: 90 | Fill #1

## 2016-08-23 MED FILL — ATORVASTATIN 40 MG TABLET: 40 | 90 days supply | Qty: 90 | Fill #1

## 2016-08-23 MED FILL — VALSARTAN 160 MG TABLET: 160 | 90 days supply | Qty: 90 | Fill #1

## 2016-08-23 MED FILL — AMITRIPTYLINE HCL 25 MG TAB: 25 | 30 days supply | Qty: 30 | Fill #3

## 2016-08-24 ENCOUNTER — Telehealth: Payer: 59 | Admitting: Family

## 2016-08-24 DIAGNOSIS — B9789 Other viral agents as the cause of diseases classified elsewhere: Secondary | ICD-10-CM | POA: Diagnosis not present

## 2016-08-24 DIAGNOSIS — J069 Acute upper respiratory infection, unspecified: Secondary | ICD-10-CM | POA: Diagnosis not present

## 2016-08-24 MED ORDER — BENZONATATE 100 MG PO CAPS: 100.0000 mg | ORAL_CAPSULE | Freq: Two times a day (BID) | ORAL | 0 refills | Status: DC | PRN

## 2016-08-24 MED FILL — BENZONATATE 100 MG CAPSULE: 100 | 10 days supply | Qty: 20 | Fill #0

## 2016-08-24 NOTE — Progress Notes (Signed)
We are sorry that you are not feeling well.  Here is how we plan to help!  Based on what you have shared with me it looks like you have upper respiratory tract inflammation that has resulted in a significant cough.  Inflammation and infection in the upper respiratory tract is commonly called bronchitis and has four common causes:  Allergies, Viral Infections, Acid Reflux and Bacterial Infections.  Allergies, viruses and acid reflux are treated by controlling symptoms or eliminating the cause. An example might be a cough caused by taking certain blood pressure medications. You stop the cough by changing the medication. Another example might be a cough caused by acid reflux. Controlling the reflux helps control the cough.  Based on your presentation I believe you most likely have A cough due to a virus.  This is called viral bronchitis and is best treated by rest, plenty of fluids and control of the cough.  You may use Ibuprofen or Tylenol as directed to help your symptoms.     In addition you may use A non-prescription cough medication called Robitussin DAC. Take 2 teaspoons every 8 hours or Delsym: take 2 teaspoons every 12 hours., A non-prescription cough medication called Mucinex DM: take 2 tablets every 12 hours. and A prescription cough medication called Tessalon Perles 100mg. You may take 1-2 capsules every 8 hours as needed for your cough.   USE OF BRONCHODILATOR ("RESCUE") INHALERS: There is a risk from using your bronchodilator too frequently.  The risk is that over-reliance on a medication which only relaxes the muscles surrounding the breathing tubes can reduce the effectiveness of medications prescribed to reduce swelling and congestion of the tubes themselves.  Although you feel brief relief from the bronchodilator inhaler, your asthma may actually be worsening with the tubes becoming more swollen and filled with mucus.  This can delay other crucial treatments, such as oral steroid medications.  If you need to use a bronchodilator inhaler daily, several times per day, you should discuss this with your provider.  There are probably better treatments that could be used to keep your asthma under control.     HOME CARE . Only take medications as instructed by your medical team. . Complete the entire course of an antibiotic. . Drink plenty of fluids and get plenty of rest. . Avoid close contacts especially the very young and the elderly . Cover your mouth if you cough or cough into your sleeve. . Always remember to wash your hands . A steam or ultrasonic humidifier can help congestion.   GET HELP RIGHT AWAY IF: . You develop worsening fever. . You become short of breath . You cough up blood. . Your symptoms persist after you have completed your treatment plan MAKE SURE YOU   Understand these instructions.  Will watch your condition.  Will get help right away if you are not doing well or get worse.  Your e-visit answers were reviewed by a board certified advanced clinical practitioner to complete your personal care plan.  Depending on the condition, your plan could have included both over the counter or prescription medications. If there is a problem please reply  once you have received a response from your provider. Your safety is important to us.  If you have drug allergies check your prescription carefully.    You can use MyChart to ask questions about today's visit, request a non-urgent call back, or ask for a work or school excuse for 24 hours related to this e-Visit.   If it has been greater than 24 hours you will need to follow up with your provider, or enter a new e-Visit to address those concerns. You will get an e-mail in the next two days asking about your experience.  I hope that your e-visit has been valuable and will speed your recovery. Thank you for using e-visits.   

## 2016-09-10 ENCOUNTER — Ambulatory Visit: Payer: Self-pay | Admitting: Family

## 2016-09-10 ENCOUNTER — Encounter: Payer: Self-pay | Admitting: Family

## 2016-09-10 ENCOUNTER — Ambulatory Visit (INDEPENDENT_AMBULATORY_CARE_PROVIDER_SITE_OTHER): Payer: 59 | Admitting: Family

## 2016-09-10 VITALS — BP 118/82 | HR 88 | Temp 98.2°F | Resp 16 | Ht 61.25 in | Wt 149.8 lb

## 2016-09-10 DIAGNOSIS — E785 Hyperlipidemia, unspecified: Secondary | ICD-10-CM | POA: Diagnosis not present

## 2016-09-10 DIAGNOSIS — G43809 Other migraine, not intractable, without status migrainosus: Secondary | ICD-10-CM

## 2016-09-10 DIAGNOSIS — I10 Essential (primary) hypertension: Secondary | ICD-10-CM | POA: Diagnosis not present

## 2016-09-10 NOTE — Patient Instructions (Signed)
Please complete lab work prior to leaving.   

## 2016-09-10 NOTE — Progress Notes (Signed)
Pre visit review using our clinic review tool, if applicable. No additional management support is needed unless otherwise documented below in the visit note. 

## 2016-09-10 NOTE — Progress Notes (Signed)
Subjective:    Patient ID: Alyssa Berry, female    DOB: [birth year], 36 y.o.   MRN: [id number]  HPI  [name] is a 36 yr old female who presents today for follow up.  Migraines- She reports that her migraines have been well controlled.  She continues elavil for migraine prophylaxis and prn imitrex.   Lab Results  Component Value Date   CHOL 163 09/07/2015   HDL 44.60 09/07/2015   LDLCALC 91 09/07/2015   TRIG 139.0 09/07/2015   CHOLHDL 4 09/07/2015    HTN- maintained on hctz, norvasc, diovan. BP Readings from Last 3 Encounters:  09/10/16 118/82  08/15/16 118/81  08/13/16 118/88   Hyperlipidemia- maintained on statin.    Review of Systems Past Medical History:  Diagnosis Date  . Arthritis   . Hyperlipidemia   . Hypertension   . Migraine      Social History   Social History  . Marital status: Married    Spouse name: N/A  . Number of children: N/A  . Years of education: N/A   Occupational History  . Not on file.   Social History Main Topics  . Smoking status: Never Smoker  . Smokeless tobacco: Not on file  . Alcohol use No  . Drug use: No  . Sexual activity: Not on file   Other Topics Concern  . Not on file   Social History Narrative   Married   3 Children   2002- daughter Otho KetKaityln   2004- Cathryn   2008Alphonzo Lemmings- Vegeta   [place] [employer] (works with Mayra ReelKate Clark NP)   Completed associates degree   Enjoys drawing    Past Surgical History:  Procedure Laterality Date  . TUBAL LIGATION  2008    Family History  Problem Relation Age of Onset  . Hyperlipidemia Mother   . Hypertension Mother   . Cancer Maternal Grandmother     lung  . Hyperlipidemia Maternal Grandmother   . Hypertension Maternal Grandmother   . Hyperlipidemia Maternal Grandfather     No Known Allergies  Current Outpatient Prescriptions on File Prior to Visit  Medication Sig Dispense Refill  . amitriptyline (ELAVIL) 25 MG tablet Take 1 tablet (25 mg total) by mouth at  bedtime. 30 tablet 3  . amLODipine (NORVASC) 5 MG tablet Take 1 tablet (5 mg total) by mouth daily. 90 tablet 1  . atorvastatin (LIPITOR) 40 MG tablet Take 1 tablet (40 mg total) by mouth daily. 90 tablet 1  . hydrochlorothiazide (MICROZIDE) 12.5 MG capsule Take 1 capsule (12.5 mg total) by mouth daily. 90 capsule 1  . ibuprofen (ADVIL,MOTRIN) 600 MG tablet Take 1 tablet (600 mg total) by mouth every 6 (six) hours as needed. 30 tablet 0  . potassium chloride SA (K-DUR,KLOR-CON) 20 MEQ tablet Take 1 tablet (20 mEq total) by mouth daily. 30 tablet 5  . SUMAtriptan (IMITREX) 50 MG tablet 1 tab at start of migraine may repeat in 2 hours if needed.  Max 2 tabs/24 hours 10 tablet 5  . valsartan (DIOVAN) 160 MG tablet Take 1 tablet (160 mg total) by mouth daily. 90 tablet 1   No current facility-administered medications on file prior to visit.     BP 118/82 (BP Location: Right Arm, Cuff Size: Normal)   Pulse 88   Temp 98.2 F (36.8 C) (Oral)   Resp 16   Ht 5' 1.25" (1.556 m)   Wt 149 lb 12.8 oz (67.9 kg)   LMP 08/28/2016  SpO2 100%   BMI 28.07 kg/m       Objective:   Physical Exam  Constitutional: She is oriented to person, place, and time. She appears well-developed and well-nourished.  HENT:  Head: Normocephalic and atraumatic.  Cardiovascular: Normal rate, regular rhythm and normal heart sounds.   No murmur heard. Pulmonary/Chest: Effort normal and breath sounds normal. No respiratory distress. She has no wheezes.  Musculoskeletal: She exhibits no edema.  Neurological: She is alert and oriented to person, place, and time.  Psychiatric: She has a normal mood and affect. Her behavior is normal. Judgment and thought content normal.          Assessment & Plan:

## 2016-09-11 LAB — BASIC METABOLIC PANEL
BUN: 13 mg/dL (ref 6–23)
CALCIUM: 9.8 mg/dL (ref 8.4–10.5)
CO2: 29 meq/L (ref 19–32)
CREATININE: 0.57 mg/dL (ref 0.40–1.20)
Chloride: 99 mEq/L (ref 96–112)
GFR: 127.7 mL/min (ref >60.00)
Glucose, Bld: 85 mg/dL (ref 70–99)
Potassium: 4.1 mEq/L (ref 3.5–5.1)
Sodium: 139 mEq/L (ref 135–145)

## 2016-09-11 LAB — LIPID PANEL
CHOL/HDL RATIO: 3
CHOLESTEROL: 168 mg/dL (ref 0–200)
HDL: 54 mg/dL (ref >39.00)
LDL Cholesterol: 86 mg/dL (ref 0–99)
NonHDL: 113.9
TRIGLYCERIDES: 141 mg/dL (ref 0.0–149.0)
VLDL: 28.2 mg/dL (ref 0.0–40.0)

## 2016-09-11 NOTE — Assessment & Plan Note (Signed)
Stable on current medications, continue same.  

## 2016-09-11 NOTE — Assessment & Plan Note (Signed)
BP stable on current medications.  Continue same, obtain bmet.  

## 2016-09-11 NOTE — Assessment & Plan Note (Signed)
Tolerating statin, obtain follow-up lipid panel. 

## 2016-09-24 MED FILL — POTASSIUM CL ER 20 MEQ TAB: 20 | 30 days supply | Qty: 30 | Fill #4

## 2016-10-10 ENCOUNTER — Encounter: Payer: Self-pay | Admitting: Family

## 2016-10-11 MED ORDER — AMITRIPTYLINE HCL 25 MG PO TABS: 25.0000 mg | ORAL_TABLET | Freq: Every day | ORAL | 5 refills | Status: DC

## 2016-10-11 MED FILL — AMITRIPTYLINE HCL 25 MG TAB: 25 | 30 days supply | Qty: 30 | Fill #0

## 2016-10-15 MED FILL — AMITRIPTYLINE HCL 10 MG TAB: 10 | 90 days supply | Qty: 90 | Fill #1

## 2016-10-20 DIAGNOSIS — H5213 Myopia, bilateral: Secondary | ICD-10-CM | POA: Diagnosis not present

## 2016-11-02 MED FILL — POTASSIUM CL ER 20 MEQ TAB: 20 | 30 days supply | Qty: 30 | Fill #5

## 2016-11-16 MED FILL — AMITRIPTYLINE HCL 25 MG TAB: 25 | 30 days supply | Qty: 30 | Fill #1

## 2016-11-21 ENCOUNTER — Encounter: Payer: Self-pay | Admitting: Family

## 2016-11-22 MED ORDER — VALSARTAN 160 MG PO TABS: 160.0000 mg | ORAL_TABLET | Freq: Every day | ORAL | 1 refills | Status: DC

## 2016-11-22 MED ORDER — AMLODIPINE BESYLATE 5 MG PO TABS: 5.0000 mg | ORAL_TABLET | Freq: Every day | ORAL | 1 refills | Status: DC

## 2016-11-22 MED ORDER — HYDROCHLOROTHIAZIDE 12.5 MG PO CAPS: 12.5000 mg | ORAL_CAPSULE | Freq: Every day | ORAL | 1 refills | Status: DC

## 2016-11-22 MED FILL — VALSARTAN 160 MG TABLET: 160 | 90 days supply | Qty: 90 | Fill #0

## 2016-11-22 MED FILL — HYDROCHLOROTHIAZIDE 12.5 MG: 12.5 | 90 days supply | Qty: 90 | Fill #0

## 2016-11-22 MED FILL — AMLODIPINE BESYLATE 5 MG TA: 5 | 90 days supply | Qty: 90 | Fill #0

## 2016-12-20 MED FILL — AMITRIPTYLINE HCL 25 MG TAB: 25 | 30 days supply | Qty: 30 | Fill #2

## 2016-12-27 ENCOUNTER — Encounter: Payer: Self-pay | Admitting: Family

## 2016-12-27 ENCOUNTER — Other Ambulatory Visit: Payer: Self-pay | Admitting: Family

## 2016-12-27 MED ORDER — ATORVASTATIN CALCIUM 40 MG PO TABS: 40.0000 mg | ORAL_TABLET | Freq: Every day | ORAL | 0 refills | Status: DC

## 2016-12-27 MED FILL — ATORVASTATIN 40 MG TABLET: 40 | 90 days supply | Qty: 90 | Fill #0

## 2016-12-27 MED FILL — POTASSIUM CL ER 20 MEQ TAB: 20 | 30 days supply | Qty: 30 | Fill #0

## 2016-12-27 NOTE — Telephone Encounter (Signed)
Refill sent per [facility] refill protocol/SLS  

## 2017-02-08 MED FILL — POTASSIUM CL ER 20 MEQ TAB: 20 | 30 days supply | Qty: 30 | Fill #1

## 2017-02-08 MED FILL — AMITRIPTYLINE HCL 25 MG TAB: 25 | 30 days supply | Qty: 30 | Fill #3

## 2017-02-22 MED FILL — AMLODIPINE BESYLATE 5 MG TA: 5 | 90 days supply | Qty: 90 | Fill #1

## 2017-02-22 MED FILL — HYDROCHLOROTHIAZIDE 12.5 MG: 12.5 | 90 days supply | Qty: 90 | Fill #1

## 2017-02-22 MED FILL — VALSARTAN 160 MG TABLET: 160 | 90 days supply | Qty: 90 | Fill #1

## 2017-03-07 MED FILL — POTASSIUM CL ER 20 MEQ TAB: 20 | 30 days supply | Qty: 30 | Fill #2

## 2017-03-07 MED FILL — AMITRIPTYLINE HCL 25 MG TAB: 25 | 30 days supply | Qty: 30 | Fill #4

## 2017-04-11 MED FILL — AMITRIPTYLINE HCL 25 MG TAB: 25 | 30 days supply | Qty: 30 | Fill #5

## 2017-05-10 ENCOUNTER — Telehealth: Payer: 59 | Admitting: Nurse Practitioner

## 2017-05-10 ENCOUNTER — Ambulatory Visit (INDEPENDENT_AMBULATORY_CARE_PROVIDER_SITE_OTHER): Payer: 59 | Admitting: Family

## 2017-05-10 ENCOUNTER — Encounter: Payer: Self-pay | Admitting: Family

## 2017-05-10 VITALS — BP 115/77 | HR 101 | Temp 98.6°F | Resp 16 | Ht 61.0 in | Wt 151.0 lb

## 2017-05-10 DIAGNOSIS — R059 Cough, unspecified: Secondary | ICD-10-CM

## 2017-05-10 DIAGNOSIS — Z0000 Encounter for general adult medical examination without abnormal findings: Secondary | ICD-10-CM

## 2017-05-10 DIAGNOSIS — E785 Hyperlipidemia, unspecified: Secondary | ICD-10-CM | POA: Diagnosis not present

## 2017-05-10 DIAGNOSIS — G43909 Migraine, unspecified, not intractable, without status migrainosus: Secondary | ICD-10-CM | POA: Diagnosis not present

## 2017-05-10 DIAGNOSIS — I10 Essential (primary) hypertension: Secondary | ICD-10-CM | POA: Diagnosis not present

## 2017-05-10 DIAGNOSIS — R05 Cough: Secondary | ICD-10-CM | POA: Diagnosis not present

## 2017-05-10 DIAGNOSIS — N926 Irregular menstruation, unspecified: Secondary | ICD-10-CM

## 2017-05-10 NOTE — Patient Instructions (Addendum)
Please complete lab work prior to leaving. Continue to work on healthy diet, regular exercise and weight loss.  

## 2017-05-10 NOTE — Progress Notes (Signed)
Subjective:    Patient ID: Alyssa Berry, female    DOB: [birth year], 36 y.o.   MRN: [id number]  HPI   Patient presents today for complete physical.  Immunizations: 2013 tetanus Diet: reports some days better than others Exercise: walks with her kids for 1 hour 3 days a week.  Pap Smear: pap 4/14, will go to GYN.   Wt Readings from Last 3 Encounters:  05/10/17 151 lb (68.5 kg)  09/10/16 149 lb 12.8 oz (67.9 kg)  08/15/16 150 lb 12.8 oz (68.4 kg)   HTN- maintained on hctz, valsartan, amlodipine.  Migraines- see ROS.    Review of Systems  Constitutional: Negative for unexpected weight change.  HENT: Negative for hearing loss.        Mild uri symptoms  Eyes: Negative for visual disturbance.  Respiratory: Negative for shortness of breath.   Cardiovascular: Negative for chest pain.  Gastrointestinal: Negative for constipation and diarrhea.  Genitourinary: Negative for dysuria and frequency.  Musculoskeletal: Negative for arthralgias and myalgias.  Skin: Negative for rash.  Neurological:       Reports occasional HA, 2 migraines/month.  Reports that symptoms improve with ibuprofen, imitrex makes her too drowsy  Hematological: Negative for adenopathy.  Psychiatric/Behavioral:       Denies depression/anxiety   Past Medical History:  Diagnosis Date  . Arthritis   . Hyperlipidemia   . Hypertension   . Migraine      Social History   Social History  . Marital status: Married    Spouse name: N/A  . Number of children: N/A  . Years of education: N/A   Occupational History  . Not on file.   Social History Main Topics  . Smoking status: Never Smoker  . Smokeless tobacco: Never Used  . Alcohol use No  . Drug use: No  . Sexual activity: Not on file     Comment: tubal ligation   Other Topics Concern  . 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 TUBAL LIGATION  2008    Family History  Problem Relation Age of Onset  . Hyperlipidemia Mother   . Hypertension Mother   . Cancer Maternal Grandmother        lung  . Hyperlipidemia Maternal Grandmother   . Hypertension Maternal Grandmother   . Hyperlipidemia Maternal Grandfather   . Depression Daughter   . Hypertension Sister   . Hypertension Brother     No Known Allergies  Current Outpatient Prescriptions on File Prior to Visit  Medication Sig Dispense Refill  . amitriptyline (ELAVIL) 25 MG tablet Take 1 tablet (25 mg total) by mouth at bedtime. 30 tablet 5  . amLODipine (NORVASC) 5 MG tablet Take 1 tablet (5 mg total) by mouth daily. 90 tablet 1  . atorvastatin (LIPITOR) 40 MG tablet Take 1 tablet (40 mg total) by mouth daily. 90 tablet 0  . hydrochlorothiazide (MICROZIDE) 12.5 MG capsule Take 1 capsule (12.5 mg total) by mouth daily. 90 capsule 1  . ibuprofen (ADVIL,MOTRIN) 600 MG tablet Take 1 tablet (600 mg total) by mouth every 6 (six) hours as needed. 30 tablet 0  . potassium chloride SA (K-DUR,KLOR-CON) 20 MEQ tablet TAKE 1  TABLET BY MOUTH DAILY. 30 tablet 2  . SUMAtriptan (IMITREX) 50 MG tablet 1 tab at start of migraine may repeat in 2 hours if needed.  Max 2 tabs/24 hours 10 tablet 5  . valsartan (DIOVAN) 160 MG tablet Take 1 tablet (160 mg total) by mouth daily. 90 tablet 1   No current facility-administered medications on file prior to visit.     BP 115/77 (BP Location: Right Arm, Cuff Size: Normal)   Pulse (!) 101   Temp 98.6 F (37 C) (Oral)   Resp 16   Ht 5\' 1"  (1.549 m)   Wt 151 lb (68.5 kg)   LMP 04/22/2017   SpO2 100%   BMI 28.53 kg/m        Objective:   Physical Exam Physical Exam  Constitutional: She is oriented to person, place, and time. She appears well-developed and well-nourished. No distress.  HENT:  Head:  Normocephalic and atraumatic.  Right Ear: Tympanic membrane and ear canal normal.  Left Ear: Tympanic membrane and ear canal normal.  Mouth/Throat: Oropharynx is clear and moist.  Eyes: Pupils are equal, round, and reactive to light. No scleral icterus.  Neck: Normal range of motion. No thyromegaly present.  Cardiovascular: Normal rate and regular rhythm.   No murmur heard. Pulmonary/Chest: Effort normal and breath sounds normal. No respiratory distress. He has no wheezes. She has no rales. She exhibits no tenderness.  Abdominal: Soft. Bowel sounds are normal. She exhibits no distension and no mass. There is no tenderness. There is no rebound and no guarding.  Musculoskeletal: She exhibits no edema.  Lymphadenopathy:    She has no cervical adenopathy.  Neurological: She is alert and oriented to person, place, and time. She has normal patellar reflexes. She exhibits normal muscle tone. Coordination normal.  Skin: Skin is warm and dry.  Psychiatric: She has a normal mood and affect. Her behavior is normal. Judgment and thought content normal.  Breasts: Examined lying Right: Without masses, retractions, discharge or axillary adenopathy.  Left: Without masses, retractions, discharge or axillary adenopathy. (+ scar left areola at 3 o'clock from previous I and D) Pelvic: deferred to GYN           Assessment & Plan:    Preventative care- discussed healthy diet, exercise weight loss. Obtain routine lab work.  HTN- BP stable on current meds. Continue same.  Hyperlipidemia- lipids at goal, continue statin.  Migraines- stable, less frequent. Continue elavil for prophylaxis.  Irregular menses- refer to GYN for evaluation and PAP.       Assessment & Plan:

## 2017-05-10 NOTE — Progress Notes (Signed)
We are sorry that you are not feeling well.  Here is how we plan to help!  Based on your presentation I believe you most likely have A cough due to a virus.  This is called viral bronchitis and is best treated by rest, plenty of fluids and control of the cough.  You may use Ibuprofen or Tylenol as directed to help your symptoms.     In addition you may use A non-prescription cough medication called Mucinex DM: take 2 tablets every 12 hours.  Providers prescribe antibiotics to treat infections caused by bacteria. Antibiotics are very powerful in treating bacterial infections when they are used properly. To maintain their effectiveness, they should be used only when necessary. Overuse of antibiotics has resulted in the development of superbugs that are resistant to treatment!    After careful review of your answers, I would not recommend an antibiotic for your condition.  Antibiotics are not effective against viruses and therefore should not be used to treat them. Common examples of infections caused by viruses include colds and flu    From your responses in the eVisit questionnaire you describe inflammation in the upper respiratory tract which is causing a significant cough.  This is commonly called Bronchitis and has four common causes:    Allergies  Viral Infections  Acid Reflux  Bacterial Infection Allergies, viruses and acid reflux are treated by controlling symptoms or eliminating the cause. An example might be a cough caused by taking certain blood pressure medications. You stop the cough by changing the medication. Another example might be a cough caused by acid reflux. Controlling the reflux helps control the cough.  USE OF BRONCHODILATOR ("RESCUE") INHALERS: There is a risk from using your bronchodilator too frequently.  The risk is that over-reliance on a medication which only relaxes the muscles surrounding the breathing tubes can reduce the effectiveness of medications prescribed  to reduce swelling and congestion of the tubes themselves.  Although you feel brief relief from the bronchodilator inhaler, your asthma may actually be worsening with the tubes becoming more swollen and filled with mucus.  This can delay other crucial treatments, such as oral steroid medications. If you need to use a bronchodilator inhaler daily, several times per day, you should discuss this with your provider.  There are probably better treatments that could be used to keep your asthma under control.     HOME CARE . Only take medications as instructed by your medical team. . Complete the entire course of an antibiotic. . Drink plenty of fluids and get plenty of rest. . Avoid close contacts especially the very young and the elderly . Cover your mouth if you cough or cough into your sleeve. . Always remember to wash your hands . A steam or ultrasonic humidifier can help congestion.   GET HELP RIGHT AWAY IF: . You develop worsening fever. . You become short of breath . You cough up blood. . Your symptoms persist after you have completed your treatment plan MAKE SURE YOU   Understand these instructions.  Will watch your condition.  Will get help right away if you are not doing well or get worse.  Your e-visit answers were reviewed by a board certified advanced clinical practitioner to complete your personal care plan.  Depending on the condition, your plan could have included both over the counter or prescription medications. If there is a problem please reply  once you have received a response from your provider. Your safety is important   to us.  If you have drug allergies check your prescription carefully.    You can use MyChart to ask questions about today's visit, request a non-urgent call back, or ask for a work or school excuse for 24 hours related to this e-Visit. If it has been greater than 24 hours you will need to follow up with your provider, or enter a new e-Visit to address  those concerns. You will get an e-mail in the next two days asking about your experience.  I hope that your e-visit has been valuable and will speed your recovery. Thank you for using e-visits.   

## 2017-05-11 LAB — URINALYSIS, ROUTINE W REFLEX MICROSCOPIC
BILIRUBIN URINE: NEGATIVE
GLUCOSE, UA: NEGATIVE
HGB URINE DIPSTICK: NEGATIVE
Hyaline Cast: NONE SEEN /LPF
KETONES UR: NEGATIVE
NITRITE: NEGATIVE
PH: 6 (ref 5.0–8.0)
Protein, ur: NEGATIVE
RBC / HPF: NONE SEEN /HPF (ref 0–2)
SPECIFIC GRAVITY, URINE: 1.013 (ref 1.001–1.03)

## 2017-05-11 LAB — CBC WITH DIFFERENTIAL/PLATELET
BASOS PCT: 0.3 %
Basophils Absolute: 27 cells/uL (ref 0–200)
EOS ABS: 223 {cells}/uL (ref 15–500)
Eosinophils Relative: 2.5 %
HEMATOCRIT: 38.2 % (ref 35.0–45.0)
Hemoglobin: 12.8 g/dL (ref 11.7–15.5)
LYMPHS ABS: 2003 {cells}/uL (ref 850–3900)
MCH: 28.8 pg (ref 27.0–33.0)
MCHC: 33.5 g/dL (ref 32.0–36.0)
MCV: 86 fL (ref 80.0–100.0)
MPV: 10.3 fL (ref 7.5–12.5)
Monocytes Relative: 8 %
NEUTROS PCT: 66.7 %
Neutro Abs: 5936 cells/uL (ref 1500–7800)
Platelets: 299 10*3/uL (ref 140–400)
RBC: 4.44 10*6/uL (ref 3.80–5.10)
RDW: 12.3 % (ref 11.0–15.0)
Total Lymphocyte: 22.5 %
WBC: 8.9 10*3/uL (ref 3.8–10.8)
WBCMIX: 712 {cells}/uL (ref 200–950)

## 2017-05-11 LAB — BASIC METABOLIC PANEL WITH GFR
BUN: 8 mg/dL (ref 7–25)
CO2: 24 mmol/L (ref 20–32)
CREATININE: 0.59 mg/dL (ref 0.50–1.10)
Calcium: 9.3 mg/dL (ref 8.6–10.2)
Chloride: 100 mmol/L (ref 98–110)
GFR, EST AFRICAN AMERICAN: 137 mL/min/{1.73_m2} (ref >=60)
GFR, EST NON AFRICAN AMERICAN: 118 mL/min/{1.73_m2} (ref >=60)
Glucose, Bld: 85 mg/dL (ref 65–99)
Potassium: 3.8 mmol/L (ref 3.5–5.3)
Sodium: 137 mmol/L (ref 135–146)

## 2017-05-11 LAB — HEPATIC FUNCTION PANEL
AG Ratio: 1.4 (calc) (ref 1.0–2.5)
ALBUMIN MSPROF: 4.6 g/dL (ref 3.6–5.1)
ALT: 56 U/L — AB (ref 6–29)
AST: 25 U/L (ref 10–30)
Alkaline phosphatase (APISO): 155 U/L — ABNORMAL HIGH (ref 33–115)
BILIRUBIN DIRECT: 0.1 mg/dL (ref 0.0–0.2)
Globulin: 3.2 g/dL (calc) (ref 1.9–3.7)
Indirect Bilirubin: 0.4 mg/dL (calc) (ref 0.2–1.2)
TOTAL PROTEIN: 7.8 g/dL (ref 6.1–8.1)
Total Bilirubin: 0.5 mg/dL (ref 0.2–1.2)

## 2017-05-11 LAB — TSH: TSH: 0.84 m[IU]/L

## 2017-05-14 ENCOUNTER — Telehealth: Payer: Self-pay | Admitting: Family

## 2017-05-14 DIAGNOSIS — R748 Abnormal levels of other serum enzymes: Secondary | ICD-10-CM

## 2017-05-14 NOTE — Telephone Encounter (Signed)
Alk phos is elevated. On of her liver tests also elevated.   I would like her to complete an abdominal ultrasound and alk phos isoenzyme test to further evaluate. Other labs look good.  

## 2017-05-14 NOTE — Telephone Encounter (Signed)
Left message for pt to return my call.

## 2017-05-15 NOTE — Telephone Encounter (Signed)
Notified pt and scheduled u/s for 05/23/17 at 3:30pm and lab appt for 3:15pm on the same day. Future lab order entered.

## 2017-05-15 NOTE — Telephone Encounter (Signed)
Left detailed message on pt's voicemail to call back to schedule u/s and lab appt. Awaiting callback.

## 2017-05-23 ENCOUNTER — Ambulatory Visit (HOSPITAL_BASED_OUTPATIENT_CLINIC_OR_DEPARTMENT_OTHER)
Admission: RE | Admit: 2017-05-23 | Discharge: 2017-05-23 | Disposition: A | Discharge status: Home / Self Care | Payer: 59 | Source: Ambulatory Visit | Attending: Family | Admitting: Family

## 2017-05-23 ENCOUNTER — Other Ambulatory Visit (INDEPENDENT_AMBULATORY_CARE_PROVIDER_SITE_OTHER): Payer: 59

## 2017-05-23 DIAGNOSIS — R748 Abnormal levels of other serum enzymes: Secondary | ICD-10-CM

## 2017-05-24 ENCOUNTER — Telehealth: Payer: Self-pay | Admitting: *Deleted

## 2017-05-24 NOTE — Telephone Encounter (Signed)
Received results from [facility]; forwarded to provider/SLS 09/21   

## 2017-05-28 LAB — ALKALINE PHOSPHATASE, ISOENZYMES
Alkaline Phosphatase: 131 IU/L — ABNORMAL HIGH (ref 39–117)
BONE FRACTION: 21 % (ref 14–68)
INTESTINAL FRAC.: 0 % (ref 0–18)
LIVER FRACTION: 79 % (ref 18–85)

## 2017-05-30 ENCOUNTER — Encounter: Payer: Self-pay | Admitting: Family

## 2017-05-30 DIAGNOSIS — R748 Abnormal levels of other serum enzymes: Secondary | ICD-10-CM

## 2017-06-03 ENCOUNTER — Other Ambulatory Visit: Payer: Self-pay | Admitting: Family

## 2017-06-04 ENCOUNTER — Other Ambulatory Visit: Payer: Self-pay | Admitting: Family

## 2017-06-04 MED ORDER — ATORVASTATIN CALCIUM 40 MG PO TABS: 40.0000 mg | ORAL_TABLET | Freq: Every day | ORAL | 0 refills | Status: DC

## 2017-06-14 MED FILL — VALSARTAN 160 MG TABLET: 160 | 30 days supply | Qty: 30 | Fill #0

## 2017-06-14 MED FILL — HYDROCHLOROTHIAZIDE 12.5 MG: 12.5 | 90 days supply | Qty: 90 | Fill #0

## 2017-06-14 MED FILL — AMLODIPINE BESYLATE 5 MG TA: 5 | 90 days supply | Qty: 90 | Fill #0

## 2017-06-27 MED FILL — ATORVASTATIN 40 MG TABLET: 40 | 90 days supply | Qty: 90 | Fill #0

## 2017-07-03 DIAGNOSIS — Z1151 Encounter for screening for human papillomavirus (HPV): Secondary | ICD-10-CM | POA: Diagnosis not present

## 2017-07-03 DIAGNOSIS — Z124 Encounter for screening for malignant neoplasm of cervix: Secondary | ICD-10-CM | POA: Diagnosis not present

## 2017-07-03 DIAGNOSIS — Z130 Encounter for screening for diseases of the blood and blood-forming organs and certain disorders involving the immune mechanism: Secondary | ICD-10-CM | POA: Diagnosis not present

## 2017-07-03 DIAGNOSIS — Z01419 Encounter for gynecological examination (general) (routine) without abnormal findings: Secondary | ICD-10-CM | POA: Diagnosis not present

## 2017-07-03 DIAGNOSIS — Z1389 Encounter for screening for other disorder: Secondary | ICD-10-CM | POA: Diagnosis not present

## 2017-07-04 DIAGNOSIS — Z124 Encounter for screening for malignant neoplasm of cervix: Secondary | ICD-10-CM | POA: Diagnosis not present

## 2017-07-04 LAB — HM PAP SMEAR: HM Pap smear: NEGATIVE

## 2017-07-04 LAB — RESULTS CONSOLE HPV: CHL HPV: NEGATIVE

## 2017-07-12 MED FILL — VALSARTAN 160 MG TABS: 160 | 30 days supply | Qty: 30 | Fill #1

## 2017-07-31 ENCOUNTER — Other Ambulatory Visit: Payer: Self-pay

## 2017-07-31 ENCOUNTER — Other Ambulatory Visit (INDEPENDENT_AMBULATORY_CARE_PROVIDER_SITE_OTHER): Payer: 59

## 2017-07-31 DIAGNOSIS — R748 Abnormal levels of other serum enzymes: Secondary | ICD-10-CM | POA: Diagnosis not present

## 2017-08-01 ENCOUNTER — Telehealth: Payer: Self-pay | Admitting: *Deleted

## 2017-08-01 NOTE — Telephone Encounter (Signed)
Received results from [facility], ALP has increased to 218, other parts of tests are not in; forwarded to provider after speaking with doc-of-day/SLS 11/29

## 2017-08-02 LAB — ALKALINE PHOSPHATASE, ISOENZYMES
ALK PHOS: 218 IU/L — AB (ref 39–117)
BONE FRACTION: 29 % (ref 14–68)
INTESTINAL FRAC.: 0 % (ref 0–18)
LIVER FRACTION: 71 % (ref 18–85)

## 2017-08-02 NOTE — Telephone Encounter (Signed)
Noted  

## 2017-08-04 ENCOUNTER — Telehealth: Payer: Self-pay | Admitting: Family

## 2017-08-04 DIAGNOSIS — R748 Abnormal levels of other serum enzymes: Secondary | ICD-10-CM

## 2017-08-04 NOTE — Telephone Encounter (Signed)
Please let pt know that her GGT is elevated and that means that it is likely coming from her liver.  Is she having any nausea/right upper quadrant pain, if so, I will order hida scan. Also, I would like to refer her to GI for further evaluation.

## 2017-08-05 ENCOUNTER — Encounter: Payer: Self-pay | Admitting: Gastroenterology

## 2017-08-05 LAB — GAMMA GT: GGT: 331 IU/L — ABNORMAL HIGH (ref 0–60)

## 2017-08-05 LAB — SPECIMEN STATUS REPORT

## 2017-08-05 NOTE — Telephone Encounter (Signed)
Notified pt and she denies any nausea or abdominal pain. She is agreeable to proceed with GI referral.

## 2017-08-06 ENCOUNTER — Telehealth: Payer: Self-pay | Admitting: *Deleted

## 2017-08-06 NOTE — Telephone Encounter (Signed)
Received results from [facility]; forwarded to provider/SLS 12/04

## 2017-08-07 ENCOUNTER — Telehealth: Payer: Self-pay | Admitting: *Deleted

## 2017-08-07 NOTE — Telephone Encounter (Signed)
[facility]; forwarded to provider/SLS 12/05

## 2017-08-15 MED FILL — VALSARTAN 160 MG TABLET: 160 | 90 days supply | Qty: 90 | Fill #2

## 2017-09-04 ENCOUNTER — Other Ambulatory Visit: Payer: Self-pay | Admitting: Family

## 2017-09-12 MED FILL — HYDROCHLOROTHIAZIDE 12.5 MG: 12.5 | 90 days supply | Qty: 90 | Fill #1

## 2017-09-12 MED FILL — AMITRIPTYLINE HCL 25 MG TAB: 25 | 30 days supply | Qty: 30 | Fill #0

## 2017-09-12 MED FILL — AMLODIPINE BESYLATE 5 MG TA: 5 | 90 days supply | Qty: 90 | Fill #1

## 2017-09-12 MED FILL — POTASSIUM CL ER 20 MEQ TAB: 20 | 30 days supply | Qty: 30 | Fill #0

## 2017-09-23 ENCOUNTER — Other Ambulatory Visit (INDEPENDENT_AMBULATORY_CARE_PROVIDER_SITE_OTHER): Payer: 59

## 2017-09-23 ENCOUNTER — Encounter: Payer: Self-pay | Admitting: Gastroenterology

## 2017-09-23 ENCOUNTER — Ambulatory Visit (INDEPENDENT_AMBULATORY_CARE_PROVIDER_SITE_OTHER): Payer: 59 | Admitting: Gastroenterology

## 2017-09-23 VITALS — BP 110/82 | HR 80 | Ht 61.25 in | Wt 154.2 lb

## 2017-09-23 DIAGNOSIS — R748 Abnormal levels of other serum enzymes: Secondary | ICD-10-CM

## 2017-09-23 LAB — COMPREHENSIVE METABOLIC PANEL
ALBUMIN: 4.7 g/dL (ref 3.5–5.2)
ALT: 27 U/L (ref 0–35)
AST: 21 U/L (ref 0–37)
Alkaline Phosphatase: 112 U/L (ref 39–117)
BUN: 7 mg/dL (ref 6–23)
CHLORIDE: 98 meq/L (ref 96–112)
CO2: 28 meq/L (ref 19–32)
Calcium: 9.4 mg/dL (ref 8.4–10.5)
Creatinine, Ser: 0.53 mg/dL (ref 0.40–1.20)
GFR: 138.09 mL/min (ref >60.00)
Glucose, Bld: 98 mg/dL (ref 70–99)
POTASSIUM: 3.6 meq/L (ref 3.5–5.1)
SODIUM: 137 meq/L (ref 135–145)
Total Bilirubin: 0.3 mg/dL (ref 0.2–1.2)
Total Protein: 8.6 g/dL — ABNORMAL HIGH (ref 6.0–8.3)

## 2017-09-23 LAB — CBC WITH DIFFERENTIAL/PLATELET
BASOS PCT: 0.6 % (ref 0.0–3.0)
Basophils Absolute: 0 10*3/uL (ref 0.0–0.1)
EOS ABS: 0.1 10*3/uL (ref 0.0–0.7)
Eosinophils Relative: 1.7 % (ref 0.0–5.0)
HEMATOCRIT: 41 % (ref 36.0–46.0)
Hemoglobin: 13.8 g/dL (ref 12.0–15.0)
Lymphocytes Relative: 25.8 % (ref 12.0–46.0)
Lymphs Abs: 2 10*3/uL (ref 0.7–4.0)
MCHC: 33.6 g/dL (ref 30.0–36.0)
MCV: 87.8 fl (ref 78.0–100.0)
MONO ABS: 0.5 10*3/uL (ref 0.1–1.0)
Monocytes Relative: 6.2 % (ref 3.0–12.0)
NEUTROS ABS: 5 10*3/uL (ref 1.4–7.7)
Neutrophils Relative %: 65.7 % (ref 43.0–77.0)
PLATELETS: 363 10*3/uL (ref 150.0–400.0)
RBC: 4.67 Mil/uL (ref 3.87–5.11)
RDW: 12.5 % (ref 11.5–15.5)
WBC: 7.6 10*3/uL (ref 4.0–10.5)

## 2017-09-23 LAB — PROTIME-INR
INR: 1.1 ratio — AB (ref 0.8–1.0)
Prothrombin Time: 11.4 s (ref 9.6–13.1)

## 2017-09-23 LAB — IBC PANEL
Iron: 64 ug/dL (ref 42–145)
SATURATION RATIOS: 16.2 % — AB (ref 20.0–50.0)
Transferrin: 283 mg/dL (ref 212.0–360.0)

## 2017-09-23 LAB — IGA: IGA: 230 mg/dL (ref 68–378)

## 2017-09-23 NOTE — Progress Notes (Signed)
HPI: This is a  very pleasant 37 year old woman who was referred to me by Debbrah Alar, NP  to evaluate elevated liver tests.    Chief complaint is elevated alkaline phosphatase and GGT level  She tells me that she has liver tests checked about annually because she is on 3 blood pressure medicines.  Previously these were checked at [facility] internal medicine.  More recently she has changed to lobe our primary care and liver tests checked September 2018 showed elevated alkaline phosphatase and slightly elevated AST.  See those results all summarized below.  I reviewed all the labs available in the epic system.  She has never had hepatitis that she is aware of.  Liver disease does not run in her family.   She has never had jaundice or significant abdominal pains.    Her weight has overall been stable  She has been on her current blood pressure medicines for 7 or 8 years.  The only change was her Lipitor dosing was increased about 4 years ago.  Old Data Reviewed: Blood work 2018: Alkaline phosphatase September 2018 was 131, alk phos November 2018 was 218.  Fractionated alkaline phosphatase percentages were normal for liver and bone and intestinal.  GGT level November 2018 was elevated at 331.  Abdominal ultrasound September 2018 was normal.   Review of systems: Pertinent positive and negative review of systems were noted in the above HPI section. All other review negative.   Past Medical History:  Diagnosis Date  . Arthritis   . Hyperlipidemia   . Hypertension   . Migraine     Past Surgical History:  Procedure Laterality Date  . TUBAL LIGATION  2008    Current Outpatient Medications  Medication Sig Dispense Refill  . amitriptyline (ELAVIL) 25 MG tablet TAKE 1 TABLET BY MOUTH AT BEDTIME. 30 tablet 5  . amLODipine (NORVASC) 5 MG tablet TAKE 1 TABLET (5 MG TOTAL) BY MOUTH DAILY. 90 tablet 1  . atorvastatin (LIPITOR) 40 MG tablet Take 1 tablet (40 mg total) by mouth  daily. 90 tablet 0  . hydrochlorothiazide (MICROZIDE) 12.5 MG capsule TAKE 1 CAPSULE BY MOUTH DAILY. 90 capsule 1  . potassium chloride SA (K-DUR,KLOR-CON) 20 MEQ tablet TAKE 1 TABLET BY MOUTH ONCE DAILY 30 tablet 2  . valsartan (DIOVAN) 160 MG tablet TAKE 1 TABLET BY MOUTH DAILY. 90 tablet 1   No current facility-administered medications for this visit.     Allergies as of 09/23/2017  . (No Known Allergies)    Family History  Problem Relation Age of Onset  . Hyperlipidemia Mother   . Hypertension Mother   . Cancer Maternal Grandmother        lung  . Hyperlipidemia Maternal Grandmother   . Hypertension Maternal Grandmother   . Hyperlipidemia Maternal Grandfather   . Depression Daughter   . Hypertension Sister   . Hypertension Brother   . Colon cancer Neg Hx     Social History   Socioeconomic History  . Marital status: Married    Spouse name: Not on file  . Number of children: 3  . Years of education: Not on file  . Highest education level: Not on file  Social Needs  . Financial resource strain: Not on file  . Food insecurity - worry: Not on file  . Food insecurity - inability: Not on file  . Transportation needs - medical: Not on file  . Transportation needs - non-medical: Not on file  Occupational History  . Occupation:  CMA  Tobacco Use  . Smoking status: Never Smoker  . Smokeless tobacco: Never Used  Substance and Sexual Activity  . Alcohol use: No    Alcohol/week: 0.0 oz  . Drug use: No  . Sexual activity: Not on file    Comment: tubal ligation  Other Topics Concern  . 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)   Completed associates degree   Enjoys drawing     Physical Exam: BP 110/82   Pulse 80   Ht 5' 1.25" (1.556 m)   Wt 154 lb 4 oz (70 kg)   BMI 28.91 kg/m  Constitutional: generally well-appearing Psychiatric:  alert and oriented x3 Eyes: extraocular movements intact Mouth: oral pharynx moist, no lesions Neck: supple no lymphadenopathy Cardiovascular: heart regular rate and rhythm Lungs: clear to auscultation bilaterally Abdomen: soft, nontender, nondistended, no obvious ascites, no peritoneal signs, normal bowel sounds Extremities: no lower extremity edema bilaterally Skin: no lesions on visible extremities   Assessment and plan: 37 y.o. female with asymptomatic elevation of alkaline phosphatase and GGT  It is not clear to me whether she actually has any liver pathology.  Indeed fractionated alkaline phosphatase levels were all normal.  Possibly this is from her Maxide blood pressure medicine which does list cholestatic jaundice as a potential side effect.  I recommended a battery of blood test to exclude other potential causes of chronic liver disease the.  See the patient instructions for full details.  Depending on what those show I will advise her on further testing.    Please see the "Patient Instructions" section for addition details about the plan.   Owens Loffler, MD [facility] 09/23/2017, 1:18 PM  Cc: Debbrah Alar, NP

## 2017-09-23 NOTE — Patient Instructions (Addendum)
You will get labs drawn today:  Hepatitis A (IgM and IgG), Hepatitis B surface antigen, Hepatitis B surface antibody, Hepatitis C antibody, total iron, ferritin, TIBC, ANA, AMA,  anti smooth muscle antibody, alpha 1 antitrypsin, cerulloplasm, TTG, total IgA level, CBC, CMET, INR.  Further recommendations pending review of the above labs.  Normal BMI (Body Mass Index- based on height and weight) is between 19 and 25. Your BMI today is Body mass index is 28.91 kg/m. Marland Kitchen. Please consider follow up  regarding your BMI with your Primary Care Provider.

## 2017-09-25 LAB — ALPHA-1-ANTITRYPSIN: A-1 Antitrypsin, Ser: 149 mg/dL (ref 83–199)

## 2017-09-25 LAB — HEPATITIS B SURFACE ANTIGEN: Hepatitis B Surface Ag: NONREACTIVE

## 2017-09-25 LAB — TISSUE TRANSGLUTAMINASE, IGA: (tTG) Ab, IgA: 1 U/mL

## 2017-09-25 LAB — ANTI-SMOOTH MUSCLE ANTIBODY, IGG: Actin (Smooth Muscle) Antibody (IGG): <20 U (ref <20)

## 2017-09-25 LAB — ANA: Anti Nuclear Antibody(ANA): NEGATIVE

## 2017-09-25 LAB — CERULOPLASMIN: CERULOPLASMIN: 35 mg/dL (ref 18–53)

## 2017-09-25 LAB — HEPATITIS C ANTIBODY
HEP C AB: NONREACTIVE
SIGNAL TO CUT-OFF: 0.15 (ref <1.00)

## 2017-09-25 LAB — HEPATITIS A ANTIBODY, TOTAL: HEPATITIS A AB,TOTAL: REACTIVE — AB

## 2017-09-25 LAB — MITOCHONDRIAL ANTIBODIES: Mitochondrial M2 Ab, IgG: 98.5 U — ABNORMAL HIGH

## 2017-09-25 LAB — HEPATITIS B SURFACE ANTIBODY,QUALITATIVE: HEP B S AB: REACTIVE — AB

## 2017-10-07 ENCOUNTER — Other Ambulatory Visit: Payer: Self-pay

## 2017-10-07 DIAGNOSIS — R945 Abnormal results of liver function studies: Principal | ICD-10-CM

## 2017-10-07 DIAGNOSIS — R7989 Other specified abnormal findings of blood chemistry: Secondary | ICD-10-CM

## 2017-10-11 MED FILL — POTASSIUM CL ER 20 MEQ TABL: 20 | 30 days supply | Qty: 30 | Fill #1

## 2017-10-21 DIAGNOSIS — H5213 Myopia, bilateral: Secondary | ICD-10-CM | POA: Diagnosis not present

## 2017-11-01 ENCOUNTER — Other Ambulatory Visit: Payer: Self-pay | Admitting: Family

## 2017-11-01 MED FILL — ATORVASTATIN 40 MG TABLET: 40 | 30 days supply | Qty: 30 | Fill #0

## 2017-11-04 MED FILL — POTASSIUM CL ER 20 MEQ TABL: 20 | 30 days supply | Qty: 30 | Fill #2

## 2017-11-20 MED FILL — AMITRIPTYLINE HCL 25 MG TAB: 25 | 30 days supply | Qty: 30 | Fill #1

## 2017-11-20 MED FILL — VALSARTAN 160 MG TABLET: 160 | 30 days supply | Qty: 30 | Fill #3

## 2017-12-26 ENCOUNTER — Encounter: Payer: Self-pay | Admitting: Gastroenterology

## 2017-12-31 ENCOUNTER — Other Ambulatory Visit: Payer: Self-pay | Admitting: Family

## 2017-12-31 MED FILL — AMLODIPINE BESYLATE 5 MG TA: 5 | 90 days supply | Qty: 90 | Fill #0

## 2017-12-31 MED FILL — VALSARTAN 160 MG TABLET: 160 | 90 days supply | Qty: 90 | Fill #0

## 2017-12-31 MED FILL — HYDROCHLOROTHIAZIDE 12.5 MG: 12.5 | 90 days supply | Qty: 90 | Fill #0

## 2017-12-31 MED FILL — POTASSIUM CL ER 20 MEQ TABL: 20 | 90 days supply | Qty: 90 | Fill #0

## 2018-01-02 ENCOUNTER — Other Ambulatory Visit (INDEPENDENT_AMBULATORY_CARE_PROVIDER_SITE_OTHER): Payer: 59

## 2018-01-02 DIAGNOSIS — R945 Abnormal results of liver function studies: Secondary | ICD-10-CM | POA: Diagnosis not present

## 2018-01-02 DIAGNOSIS — R7989 Other specified abnormal findings of blood chemistry: Secondary | ICD-10-CM

## 2018-01-02 LAB — HEPATIC FUNCTION PANEL
ALT: 19 U/L (ref 0–35)
AST: 17 U/L (ref 0–37)
Albumin: 4.1 g/dL (ref 3.5–5.2)
Alkaline Phosphatase: 80 U/L (ref 39–117)
Bilirubin, Direct: 0.1 mg/dL (ref 0.0–0.3)
TOTAL PROTEIN: 7.6 g/dL (ref 6.0–8.3)
Total Bilirubin: 0.2 mg/dL (ref 0.2–1.2)

## 2018-01-06 ENCOUNTER — Other Ambulatory Visit: Payer: 59

## 2018-01-06 ENCOUNTER — Ambulatory Visit (INDEPENDENT_AMBULATORY_CARE_PROVIDER_SITE_OTHER): Payer: 59 | Admitting: Gastroenterology

## 2018-01-06 ENCOUNTER — Encounter: Payer: Self-pay | Admitting: Gastroenterology

## 2018-01-06 ENCOUNTER — Ambulatory Visit: Payer: Self-pay | Admitting: Gastroenterology

## 2018-01-06 VITALS — BP 104/70 | HR 100 | Ht 61.0 in | Wt 155.1 lb

## 2018-01-06 DIAGNOSIS — R945 Abnormal results of liver function studies: Secondary | ICD-10-CM

## 2018-01-06 DIAGNOSIS — R7989 Other specified abnormal findings of blood chemistry: Secondary | ICD-10-CM

## 2018-01-06 NOTE — Progress Notes (Signed)
Review of pertinent gastrointestinal problems: 1. Elevated liver tests (intermittent alk phos and GGT elevations, low level, all other liver tests including coags and platelets were normal), asymptomatic:  Lab workup 09/2017: Hepatitis A total antibody reactive, hepatitis B surface antigen negative, hepatitis B surface antibody reactive, hepatitis C antibody negative, iron studies normal, ANA negative, anti-smooth muscle antibody negative, alpha-1 antitrypsin normal, ceruloplasmin normal, TTG normal, IgA level normal.  Antimitochondrial antibody very elevated.  Ultrasound 05/2017 normal   HPI: This is a very pleasant 37 year old woman whom I last saw 3 months ago  She has had elevated alkaline phosphatase, intermittently only.  Most recent liver testing her alk phos was completely normal for the past 4 months.  She has generally sore lower extremities but otherwise no fatigue, no jaundice, no pruritus.  Her weight has been stable   Chief complaint is elevated liver tests  ROS: complete GI ROS as described in HPI, all other review negative.  Constitutional:  No unintentional weight loss   Past Medical History:  Diagnosis Date  . Arthritis   . Hyperlipidemia   . Hypertension   . Migraine     Past Surgical History:  Procedure Laterality Date  . TUBAL LIGATION  2008    Current Outpatient Medications  Medication Sig Dispense Refill  . amitriptyline (ELAVIL) 25 MG tablet TAKE 1 TABLET BY MOUTH AT BEDTIME. 30 tablet 5  . amLODipine (NORVASC) 5 MG tablet TAKE 1 TABLET BY MOUTH DAILY. 90 tablet 1  . atorvastatin (LIPITOR) 40 MG tablet TAKE 1 TABLET BY MOUTH ONCE DAILY 30 tablet 0  . hydrochlorothiazide (MICROZIDE) 12.5 MG capsule TAKE 1 CAPSULE BY MOUTH DAILY. 90 capsule 1  . potassium chloride SA (K-DUR,KLOR-CON) 20 MEQ tablet TAKE 1 TABLET BY MOUTH ONCE DAILY 90 tablet 1  . valsartan (DIOVAN) 160 MG tablet TAKE 1 TABLET BY MOUTH DAILY. 90 tablet 1   No current facility-administered  medications for this visit.     Allergies as of 01/06/2018  . (No Known Allergies)    Family History  Problem Relation Age of Onset  . Hyperlipidemia Mother   . Hypertension Mother   . Cancer Maternal Grandmother        lung  . Hyperlipidemia Maternal Grandmother   . Hypertension Maternal Grandmother   . Hyperlipidemia Maternal Grandfather   . Depression Daughter   . Hypertension Sister   . Hypertension Brother   . Colon cancer Neg Hx     Social History   Socioeconomic History  . Marital status: Married    Spouse name: Not on file  . Number of children: 3  . Years of education: Not on file  . Highest education level: Not on file  Occupational History  . Occupation: CMA  Social Needs  . Financial resource strain: Not on file  . Food insecurity:    Worry: Not on file    Inability: Not on file  . Transportation needs:    Medical: Not on file    Non-medical: Not on file  Tobacco Use  . Smoking status: Never Smoker  . Smokeless tobacco: Never Used  Substance and Sexual Activity  . Alcohol use: No    Alcohol/week: 0.0 oz  . Drug use: No  . Sexual activity: Not on file    Comment: tubal ligation  Lifestyle  . Physical activity:    Days per week: Not on file    Minutes per session: Not on file  . Stress: Not on file  Relationships  .  Social connections:    Talks on phone: Not on file    Gets together: Not on file    Attends religious service: Not on file    Active member of club or organization: Not on file    Attends meetings of clubs or organizations: Not on file    Relationship status: Not on file  . Intimate partner violence:    Fear of current or ex partner: Not on file    Emotionally abused: Not on file    Physically abused: Not on file    Forced sexual activity: Not on file  Other Topics Concern  . 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 female with intermittently elevated alkaline phosphatase, very elevated AMA   in the past 3 or 4 months her liver tests have been checked twice and have all been completely normal.  She does have a very elevated AMA level.  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  Treatment for that resolves around ursodiol and following alkaline phosphatase levels.  I am not certain whether that she has that disease in fact however.  I am going to recheck her AMA and if it is still very elevated I will refer her to [facility] satellite liver clinic here in town for guidance.  Literature search suggests that oftentimes in this situation (positive AMA but currently negative alkaline phosphatase) patients will over time progressed to [place].  Liver biopsy may be helpful.    Please see the "Patient Instructions" section for addition details about the plan.  Owens Loffler, MD [facility] 01/06/2018, 3:44 PM

## 2018-01-06 NOTE — Patient Instructions (Addendum)
Labs today (AMA level). Pending that result, you may be referred to [facility] liver clinic here in town.  Normal BMI (Body Mass Index- based on height and weight) is between 19 and 25. Your BMI today is Body mass index is 29.31 kg/m. Marland Kitchen Please consider follow up  regarding your BMI with your Primary Care Provider.

## 2018-01-08 LAB — MITOCHONDRIAL ANTIBODIES: Mitochondrial M2 Ab, IgG: 68.9 U — ABNORMAL HIGH

## 2018-01-17 ENCOUNTER — Encounter: Payer: Self-pay | Admitting: Family

## 2018-01-17 ENCOUNTER — Ambulatory Visit: Payer: 59 | Admitting: Family

## 2018-01-17 VITALS — BP 109/83 | HR 95 | Temp 98.4°F | Resp 16 | Ht 61.0 in | Wt 151.2 lb

## 2018-01-17 DIAGNOSIS — I10 Essential (primary) hypertension: Secondary | ICD-10-CM | POA: Diagnosis not present

## 2018-01-17 DIAGNOSIS — E1169 Type 2 diabetes mellitus with other specified complication: Secondary | ICD-10-CM | POA: Diagnosis not present

## 2018-01-17 DIAGNOSIS — E785 Hyperlipidemia, unspecified: Secondary | ICD-10-CM | POA: Diagnosis not present

## 2018-01-17 DIAGNOSIS — G43909 Migraine, unspecified, not intractable, without status migrainosus: Secondary | ICD-10-CM

## 2018-01-17 MED ORDER — ATORVASTATIN CALCIUM 40 MG PO TABS: 40.0000 mg | ORAL_TABLET | Freq: Every day | ORAL | 1 refills | Status: DC

## 2018-01-17 MED ORDER — AMITRIPTYLINE HCL 25 MG PO TABS: 25.0000 mg | ORAL_TABLET | Freq: Every day | ORAL | 1 refills | Status: DC

## 2018-01-17 MED ORDER — AMLODIPINE BESYLATE 5 MG PO TABS: 5.0000 mg | ORAL_TABLET | Freq: Every day | ORAL | 1 refills | Status: DC

## 2018-01-17 NOTE — Patient Instructions (Signed)
Please complete lab work prior to leaving.   

## 2018-01-17 NOTE — Progress Notes (Signed)
Subjective:    Patient ID: Alyssa Berry, female    DOB: 09-Feb-1981, 37 y.o.   MRN: [id number]  HPI  Migraine- rare migraines.  Occasional tension/sinus headaches.  Reports that ibuprofen usually helps.  HTN- Reports good med compliance. Denies CP/SOB/swelling.  BP Readings from Last 3 Encounters:  01/17/18 109/83  01/06/18 104/70  09/23/17 110/82   Hyperlipidemia-  Lab Results  Component Value Date   CHOL 168 09/10/2016   HDL 54.00 09/10/2016   LDLCALC 86 09/10/2016   TRIG 141.0 09/10/2016   CHOLHDL 3 09/10/2016     Review of Systems See HPI  Past Medical History:  Diagnosis Date  . Arthritis   . Hyperlipidemia   . Hypertension   . Migraine      Social History   Socioeconomic History  . Marital status: Married    Spouse name: Not on file  . Number of children: 3  . Years of education: Not on file  . Highest education level: Not on file  Occupational History  . Occupation: CMA  Social Needs  . Financial resource strain: Not on file  . Food insecurity:    Worry: Not on file    Inability: Not on file  . Transportation needs:    Medical: Not on file    Non-medical: Not on file  Tobacco Use  . Smoking status: Never Smoker  . Smokeless tobacco: Never Used  Substance and Sexual Activity  . Alcohol use: No    Alcohol/week: 0.0 oz  . Drug use: No  . Sexual activity: Not on file    Comment: tubal ligation  Lifestyle  . Physical activity:    Days per week: Not on file    Minutes per session: Not on file  . Stress: Not on file  Relationships  . Social connections:    Talks on phone: Not on file    Gets together: Not on file    Attends religious service: Not on file    Active member of club or organization: Not on file    Attends meetings of clubs or organizations: Not on file    Relationship status: Not on file  . Intimate partner violence:    Fear of current or ex partner: Not on file    Emotionally abused: Not on file    Physically abused:  Not on file    Forced sexual activity: Not on file  Other Topics Concern  . 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 TUBAL LIGATION  2008    Family History  Problem Relation Age of Onset  . Hyperlipidemia Mother   . Hypertension Mother   . Cancer Maternal Grandmother        lung  . Hyperlipidemia Maternal Grandmother   . Hypertension Maternal Grandmother   . Hyperlipidemia Maternal Grandfather   . Depression Daughter   . Hypertension Sister   . Hypertension Brother   . Colon cancer Neg Hx     No Known Allergies  Current Outpatient Medications on File Prior to Visit  Medication Sig Dispense Refill  . hydrochlorothiazide (MICROZIDE) 12.5 MG capsule TAKE 1 CAPSULE BY MOUTH DAILY. 90 capsule 1  . potassium chloride SA (K-DUR,KLOR-CON) 20  MEQ tablet TAKE 1 TABLET BY MOUTH ONCE DAILY 90 tablet 1  . valsartan (DIOVAN) 160 MG tablet TAKE 1 TABLET BY MOUTH DAILY. 90 tablet 1   No current facility-administered medications on file prior to visit.     BP 109/83 (BP Location: Right Arm, Patient Position: Sitting, Cuff Size: Small)   Pulse 95   Temp 98.4 F (36.9 C) (Oral)   Resp 16   Ht  (1.549 m)   Wt 151 lb 3.2 oz (68.6 kg)   LMP 01/17/2018   SpO2 100%   BMI 28.57 kg/m       Objective:   Physical Exam  Constitutional: She is oriented to person, place, and time. She appears well-developed and well-nourished.  Cardiovascular: Normal rate, regular rhythm and normal heart sounds.  No murmur heard. Pulmonary/Chest: Effort normal and breath sounds normal. No respiratory distress. She has no wheezes.  Musculoskeletal: She exhibits no edema.  Lymphadenopathy:    She has no cervical adenopathy.  Neurological: She is alert and oriented  to person, place, and time.  Skin: Skin is warm and dry.  Psychiatric: She has a normal mood and affect. Her behavior is normal. Judgment and thought content normal.          Assessment & Plan:  HTN- bp stable on current meds, continue same.   Hyperlipidemia- last lipid panel stable. Continue statin, check follow up lipid panel.   Migraines- stable on elavil, continue same.

## 2018-01-18 LAB — LIPID PANEL
CHOL/HDL RATIO: 3.3 (calc) (ref <5.0)
Cholesterol: 154 mg/dL (ref <200)
HDL: 46 mg/dL — AB (ref >50)
LDL Cholesterol (Calc): 92 mg/dL (calc)
NON-HDL CHOLESTEROL (CALC): 108 mg/dL (ref <130)
Triglycerides: 73 mg/dL (ref <150)

## 2018-01-18 LAB — COMPREHENSIVE METABOLIC PANEL
AG Ratio: 1.3 (calc) (ref 1.0–2.5)
ALBUMIN MSPROF: 4.3 g/dL (ref 3.6–5.1)
ALKALINE PHOSPHATASE (APISO): 83 U/L (ref 33–115)
ALT: 21 U/L (ref 6–29)
AST: 17 U/L (ref 10–30)
BILIRUBIN TOTAL: 0.4 mg/dL (ref 0.2–1.2)
BUN: 9 mg/dL (ref 7–25)
CALCIUM: 8.7 mg/dL (ref 8.6–10.2)
CO2: 24 mmol/L (ref 20–32)
Chloride: 105 mmol/L (ref 98–110)
Creat: 0.62 mg/dL (ref 0.50–1.10)
Globulin: 3.3 g/dL (calc) (ref 1.9–3.7)
Glucose, Bld: 84 mg/dL (ref 65–99)
POTASSIUM: 3.8 mmol/L (ref 3.5–5.3)
Sodium: 138 mmol/L (ref 135–146)
Total Protein: 7.6 g/dL (ref 6.1–8.1)

## 2018-01-22 ENCOUNTER — Encounter: Payer: Self-pay | Admitting: Family

## 2018-01-29 DIAGNOSIS — K743 Primary biliary cirrhosis: Secondary | ICD-10-CM | POA: Diagnosis not present

## 2018-02-14 MED FILL — AMITRIPTYLINE HCL 25 MG TAB: 25 | 30 days supply | Qty: 30 | Fill #2

## 2018-03-03 MED FILL — D3 HIGH POTENCY 1000 UNIT C: 25 MCG | 100 days supply | Qty: 100 | Fill #0

## 2018-04-24 MED FILL — ATORVASTATIN 40 MG TABLET: 40 | 90 days supply | Qty: 90 | Fill #0

## 2018-04-24 MED FILL — HYDROCHLOROTHIAZIDE 12.5 MG: 12.5 | 90 days supply | Qty: 90 | Fill #1

## 2018-04-24 MED FILL — AMLODIPINE BESYLATE 5 MG TA: 5 | 90 days supply | Qty: 90 | Fill #1

## 2018-04-24 MED FILL — VALSARTAN 160 MG TABLET: 160 | 90 days supply | Qty: 90 | Fill #1

## 2018-05-16 ENCOUNTER — Encounter: Payer: Self-pay | Admitting: Family

## 2018-05-16 ENCOUNTER — Ambulatory Visit (INDEPENDENT_AMBULATORY_CARE_PROVIDER_SITE_OTHER): Payer: 59 | Admitting: Family

## 2018-05-16 VITALS — BP 110/78 | HR 95 | Temp 98.8°F | Resp 20 | Ht 61.0 in | Wt 147.0 lb

## 2018-05-16 DIAGNOSIS — Z0000 Encounter for general adult medical examination without abnormal findings: Secondary | ICD-10-CM

## 2018-05-16 DIAGNOSIS — K743 Primary biliary cirrhosis: Secondary | ICD-10-CM

## 2018-05-16 NOTE — Progress Notes (Signed)
Subjective:    Patient ID: Alyssa Berry, female    DOB: 08/19/1981, 37 y.o.   MRN: [id number]  HPI  Alyssa Berry is a 37 yr old female who presents today for complete physical.  Patient presents today for complete physical.  Immunizations: tdap 2013 Diet: healthy Wt Readings from Last 3 Encounters:  05/16/18 147 lb (66.7 kg)  01/17/18 151 lb 3.2 oz (68.6 kg)  01/06/18 155 lb 2 oz (70.4 kg)    Exercise: walks 3 x a week for 1 hour Pap Smear: 07/03/17 Vision: 1/19 Dental: 2 years ago    Review of Systems  Constitutional: Negative for unexpected weight change.  HENT: Negative for hearing loss and rhinorrhea.   Eyes: Negative for visual disturbance.  Respiratory: Negative for cough.   Cardiovascular: Negative for leg swelling.  Gastrointestinal: Negative for blood in stool, constipation and diarrhea.  Genitourinary: Negative for dysuria, frequency and hematuria.  Musculoskeletal: Negative for arthralgias and myalgias.  Skin: Negative for rash.  Neurological: Negative for headaches.  Hematological: Negative for adenopathy.  Psychiatric/Behavioral:       Denies depression/anxiety       Past Medical History:  Diagnosis Date  . Arthritis   . Hypertension   . Migraine   . Primary biliary cirrhosis (HCC)      Social History   Socioeconomic History  . Marital status: Married    Spouse name: Not on file  . Number of children: 3  . Years of education: Not on file  . Highest education level: Not on file  Occupational History  . Occupation: CMA  Social Needs  . Financial resource strain: Not on file  . Food insecurity:    Worry: Not on file    Inability: Not on file  . Transportation needs:    Medical: Not on file    Non-medical: Not on file  Tobacco Use  . Smoking status: Never Smoker  . Smokeless tobacco: Never Used  Substance and Sexual Activity  . Alcohol use: No    Alcohol/week: 0.0 standard drinks  . Drug use: No  . Sexual activity: Not on file     Comment: tubal ligation  Lifestyle  . Physical activity:    Days per week: Not on file    Minutes per session: Not on file  . Stress: Not on file  Relationships  . Social connections:    Talks on phone: Not on file    Gets together: Not on file    Attends religious service: Not on file    Active member of club or organization: Not on file    Attends meetings of clubs or organizations: Not on file    Relationship status: Not on file  . Intimate partner violence:    Fear of current or ex partner: Not on file    Emotionally abused: Not on file    Physically abused: Not on file    Forced sexual activity: Not on file  Other Topics Concern  . 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 TUBAL LIGATION  2008    Family History  Problem Relation Age of Onset  . Hyperlipidemia Mother   . Hypertension Mother   .  Cancer Maternal Grandmother        lung  . Hyperlipidemia Maternal Grandmother   . Hypertension Maternal Grandmother   . Hyperlipidemia Maternal Grandfather   . Depression Daughter   . Hypertension Sister   . Hypertension Brother   . Colon cancer Neg Hx     No Known Allergies  Current Outpatient Medications on File Prior to Visit  Medication Sig Dispense Refill  . amitriptyline (ELAVIL) 25 MG tablet Take 1 tablet (25 mg total) by mouth at bedtime. 90 tablet 1  . amLODipine (NORVASC) 5 MG tablet Take 1 tablet (5 mg total) by mouth daily. 90 tablet 1  . atorvastatin (LIPITOR) 40 MG tablet Take 1 tablet (40 mg total) by mouth daily. 90 tablet 1  . hydrochlorothiazide (MICROZIDE) 12.5 MG capsule TAKE 1 CAPSULE BY MOUTH DAILY. 90 capsule 1  . Multiple Vitamins-Minerals (MULTIVITAL) tablet Take 1 tablet by mouth daily. WITH POTASSIUM    .  valsartan (DIOVAN) 160 MG tablet TAKE 1 TABLET BY MOUTH DAILY. 90 tablet 1   No current facility-administered medications on file prior to visit.     BP 110/78 (BP Location: Right Arm, Cuff Size: Normal)   Pulse 95   Temp 98.8 F (37.1 C) (Oral)   Resp 20   Ht 5\' 1"  (1.549 m)   Wt 147 lb (66.7 kg)   LMP 05/16/2018   SpO2 100%   BMI 27.78 kg/m    Objective:   Physical Exam  Physical Exam  Constitutional: She is oriented to person, place, and time. She appears well-developed and well-nourished. No distress.  HENT:  Head: Normocephalic and atraumatic.  Right Ear: Tympanic membrane and ear canal normal.  Left Ear: Tympanic membrane and ear canal normal.  Mouth/Throat: Oropharynx is clear and moist.  Eyes: Pupils are equal, round, and reactive to light. No scleral icterus.  Neck: Normal range of motion. No thyromegaly present.  Cardiovascular: Normal rate and regular rhythm.   No murmur heard. Pulmonary/Chest: Effort normal and breath sounds normal. No respiratory distress. He has no wheezes. She has no rales. She exhibits no tenderness.  Abdominal: Soft. Bowel sounds are normal. She exhibits no distension and no mass. There is no tenderness. There is no rebound and no guarding.  Musculoskeletal: She exhibits no edema.  Lymphadenopathy:    She has no cervical adenopathy.  Neurological: She is alert and oriented to person, place, and time. She has normal patellar reflexes. She exhibits normal muscle tone. Coordination normal.  Skin: Skin is warm and dry.  Psychiatric: She has a normal mood and affect. Her behavior is normal. Judgment and thought content normal.  Breasts: Examined lying Right: Without masses, retractions, discharge or axillary adenopathy.  Left: Without masses, retractions, discharge or axillary adenopathy.  Pelvic deferred       Assessment & Plan:        Assessment & Plan:  Preventative care- commended her on her weight loss. Encouraged her to continue  healthy diet and exercise. Obtain routine lab work.  She has been diagnosed with Primary Biliary Cholangitis. Hepatologist recommended bone density testing. Will order.

## 2018-05-16 NOTE — Patient Instructions (Addendum)
Please schedule dental exam.   You should be contacted about scheduling your bone density test.

## 2018-05-17 LAB — TSH: TSH: 1.22 m[IU]/L

## 2018-05-17 LAB — LIPID PANEL
Cholesterol: 219 mg/dL — ABNORMAL HIGH (ref <200)
HDL: 43 mg/dL — AB (ref >50)
LDL CHOLESTEROL (CALC): 151 mg/dL — AB
Non-HDL Cholesterol (Calc): 176 mg/dL (calc) — ABNORMAL HIGH (ref <130)
Total CHOL/HDL Ratio: 5.1 (calc) — ABNORMAL HIGH (ref <5.0)
Triglycerides: 124 mg/dL (ref <150)

## 2018-05-17 LAB — URINALYSIS, ROUTINE W REFLEX MICROSCOPIC
Bilirubin Urine: NEGATIVE
Glucose, UA: NEGATIVE
Hyaline Cast: NONE SEEN /LPF
LEUKOCYTES UA: NEGATIVE
Nitrite: NEGATIVE
PH: 7 (ref 5.0–8.0)
Protein, ur: NEGATIVE
Specific Gravity, Urine: 1.014 (ref 1.001–1.03)
WBC, UA: NONE SEEN /HPF (ref 0–5)

## 2018-05-17 LAB — CBC WITH DIFFERENTIAL/PLATELET
BASOS ABS: 29 {cells}/uL (ref 0–200)
Basophils Relative: 0.4 %
EOS ABS: 168 {cells}/uL (ref 15–500)
EOS PCT: 2.3 %
HEMATOCRIT: 37.9 % (ref 35.0–45.0)
HEMOGLOBIN: 12.9 g/dL (ref 11.7–15.5)
LYMPHS ABS: 2606 {cells}/uL (ref 850–3900)
MCH: 29.3 pg (ref 27.0–33.0)
MCHC: 34 g/dL (ref 32.0–36.0)
MCV: 86.1 fL (ref 80.0–100.0)
MPV: 10.2 fL (ref 7.5–12.5)
Monocytes Relative: 8.5 %
NEUTROS ABS: 3876 {cells}/uL (ref 1500–7800)
NEUTROS PCT: 53.1 %
Platelets: 338 10*3/uL (ref 140–400)
RBC: 4.4 10*6/uL (ref 3.80–5.10)
RDW: 12.6 % (ref 11.0–15.0)
Total Lymphocyte: 35.7 %
WBC mixed population: 621 cells/uL (ref 200–950)
WBC: 7.3 10*3/uL (ref 3.8–10.8)

## 2018-05-17 LAB — HEPATIC FUNCTION PANEL
AG Ratio: 1.4 (calc) (ref 1.0–2.5)
ALKALINE PHOSPHATASE (APISO): 76 U/L (ref 33–115)
ALT: 18 U/L (ref 6–29)
AST: 20 U/L (ref 10–30)
Albumin: 4.6 g/dL (ref 3.6–5.1)
BILIRUBIN INDIRECT: 0.5 mg/dL (ref 0.2–1.2)
Bilirubin, Direct: 0.1 mg/dL (ref 0.0–0.2)
Globulin: 3.2 g/dL (calc) (ref 1.9–3.7)
Total Bilirubin: 0.6 mg/dL (ref 0.2–1.2)
Total Protein: 7.8 g/dL (ref 6.1–8.1)

## 2018-05-17 LAB — BASIC METABOLIC PANEL
BUN: 8 mg/dL (ref 7–25)
CALCIUM: 9.7 mg/dL (ref 8.6–10.2)
CO2: 23 mmol/L (ref 20–32)
Chloride: 101 mmol/L (ref 98–110)
Creat: 0.71 mg/dL (ref 0.50–1.10)
Glucose, Bld: 79 mg/dL (ref 65–99)
Potassium: 3.5 mmol/L (ref 3.5–5.3)
SODIUM: 138 mmol/L (ref 135–146)

## 2018-05-28 MED FILL — AMITRIPTYLINE HCL 25 MG TAB: 25 | 30 days supply | Qty: 30 | Fill #3

## 2018-06-10 ENCOUNTER — Encounter: Payer: Self-pay | Admitting: Gastroenterology

## 2018-06-10 ENCOUNTER — Ambulatory Visit: Payer: 59 | Admitting: Gastroenterology

## 2018-06-10 VITALS — BP 100/70 | HR 100 | Ht 61.0 in | Wt 149.0 lb

## 2018-06-10 DIAGNOSIS — K743 Primary biliary cirrhosis: Secondary | ICD-10-CM | POA: Diagnosis not present

## 2018-06-10 DIAGNOSIS — R7989 Other specified abnormal findings of blood chemistry: Secondary | ICD-10-CM

## 2018-06-10 DIAGNOSIS — R945 Abnormal results of liver function studies: Secondary | ICD-10-CM | POA: Diagnosis not present

## 2018-06-10 NOTE — Progress Notes (Signed)
Review of pertinent gastrointestinal problems: 1. Primary biliary cholangitis, presented with Elevated liver tests (intermittent alk phos and GGT elevations, low level, all other liver tests including coags and platelets were normal), asymptomatic:  Lab workup 09/2017: Hepatitis A total antibody reactive, hepatitis B surface antigen negative, hepatitis B surface antibody reactive, hepatitis C antibody negative, iron studies normal, ANA negative, anti-smooth muscle antibody negative, alpha-1 antitrypsin normal, ceruloplasmin normal, TTG normal, IgA level normal.  Antimitochondrial antibody very elevated.  Ultrasound 05/2017 normal  Referral to [facility] outpatient liver clinic: May 2019 visit with Arrie Aran Drazed: " She does not have indication to initiate treatment with ursodiol as her alkaline phosphatase is within normal limits" and she is asymptomatic. Follow up 3 month appt recommended.  Repeat labs September 2019 all liver tests completely normal including AST, ALT, alk phos and bilirubin.   HPI: This is a very pleasant 37 year old woman whom I last saw about 5 months ago.  Chief complaint is primary biliary cholangitis  Since her visit here she was evaluated at [facility].  See a summary of that visit above.  Full text is available in media tab in epic.  She feels perfectly fine.  No pruritus.   Repeat labs last month show completely normal LFTs    ROS: complete GI ROS as described in HPI, all other review negative.  Constitutional:  No unintentional weight loss   Past Medical History:  Diagnosis Date  . Arthritis   . Hypertension   . Migraine   . Primary biliary cirrhosis ([place])   . PVC (premature ventricular contraction)     Past Surgical History:  Procedure Laterality Date  . TUBAL LIGATION  2008    Current Outpatient Medications  Medication Sig Dispense Refill  . amitriptyline (ELAVIL) 25 MG tablet Take 1 tablet (25  mg total) by mouth at bedtime. 90 tablet 1  . amLODipine (NORVASC) 5 MG tablet Take 1 tablet (5 mg total) by mouth daily. 90 tablet 1  . atorvastatin (LIPITOR) 40 MG tablet Take 1 tablet (40 mg total) by mouth daily. 90 tablet 1  . hydrochlorothiazide (MICROZIDE) 12.5 MG capsule TAKE 1 CAPSULE BY MOUTH DAILY. 90 capsule 1  . Multiple Vitamins-Minerals (MULTIVITAL) tablet Take 1 tablet by mouth daily. WITH POTASSIUM    . valsartan (DIOVAN) 160 MG tablet TAKE 1 TABLET BY MOUTH DAILY. 90 tablet 1   No current facility-administered medications for this visit.     Allergies as of 06/10/2018  . (No Known Allergies)    Family History  Problem Relation Age of Onset  . Hyperlipidemia Mother   . Hypertension Mother   . Cancer Maternal Grandmother        lung  . Hyperlipidemia Maternal Grandmother   . Hypertension Maternal Grandmother   . Hyperlipidemia Maternal Grandfather   . Depression Daughter   . Hypertension Sister   . Hypertension Brother   . Colon cancer Neg Hx     Social History   Socioeconomic History  . Marital status: Married    Spouse name: Not on file  . Number of children: 3  . Years of education: Not on file  . Highest education level: Not on file  Occupational History  . Occupation: CMA  Social Needs  . Financial resource strain: Not on file  . Food insecurity:    Worry: Not on file    Inability: Not on file  . Transportation needs:    Medical: Not on file  Non-medical: Not on file  Tobacco Use  . Smoking status: Never Smoker  . Smokeless tobacco: Never Used  Substance and Sexual Activity  . Alcohol use: No    Alcohol/week: 0.0 standard drinks  . Drug use: No  . Sexual activity: Not on file    Comment: tubal ligation  Lifestyle  . Physical activity:    Days per week: Not on file    Minutes per session: Not on file  . Stress: Not on file  Relationships  . Social connections:    Talks on phone: Not on file    Gets together: Not on file    Attends  religious service: Not on file    Active member of club or organization: Not on file    Attends meetings of clubs or organizations: Not on file    Relationship status: Not on file  . Intimate partner violence:    Fear of current or ex partner: Not on file    Emotionally abused: Not on file    Physically abused: Not on file    Forced sexual activity: Not on file  Other Topics Concern  . 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 female with primary biliary cholangitis  Asymptomatic and only intermittently elevated alkaline phosphatase.  She does have very elevated AMA level.  No indication for treatment currently.  We will repeat her liver tests about every 6 months and if they show persistent elevation of alkaline phosphatase or if she develops symptomatic disease then she would likely be started on ursodiol  Please see the "Patient Instructions" section for addition details about the plan.  Owens Loffler, MD [facility] 06/10/2018, 3:36 PM

## 2018-06-10 NOTE — Patient Instructions (Addendum)
LFTs March 2020 and then every 6 months after that.   Thank you for entrusting me with your care and choosing [facility].  Dr Christella Hartigan

## 2018-07-21 ENCOUNTER — Encounter: Payer: Self-pay | Admitting: Family

## 2018-08-06 ENCOUNTER — Other Ambulatory Visit: Payer: Self-pay | Admitting: Family

## 2018-08-06 MED FILL — AMLODIPINE BESYLATE 5 MG TA: 5 | 90 days supply | Qty: 90 | Fill #0

## 2018-08-06 MED FILL — ATORVASTATIN 40 MG TABLET: 40 | 90 days supply | Qty: 90 | Fill #1

## 2018-08-07 MED FILL — HYDROCHLOROTHIAZIDE 12.5 MG: 12.5 | 90 days supply | Qty: 90 | Fill #0

## 2018-09-12 ENCOUNTER — Encounter: Payer: Self-pay | Admitting: Family

## 2018-09-12 ENCOUNTER — Telehealth: Payer: 59 | Admitting: Family

## 2018-09-12 DIAGNOSIS — R05 Cough: Secondary | ICD-10-CM | POA: Diagnosis not present

## 2018-09-12 DIAGNOSIS — B9689 Other specified bacterial agents as the cause of diseases classified elsewhere: Secondary | ICD-10-CM | POA: Diagnosis not present

## 2018-09-12 DIAGNOSIS — J329 Chronic sinusitis, unspecified: Secondary | ICD-10-CM | POA: Diagnosis not present

## 2018-09-12 DIAGNOSIS — R059 Cough, unspecified: Secondary | ICD-10-CM

## 2018-09-12 MED ORDER — BENZONATATE 100 MG PO CAPS: 100.0000 mg | ORAL_CAPSULE | Freq: Three times a day (TID) | ORAL | 0 refills | Status: DC | PRN

## 2018-09-12 MED ORDER — AMOXICILLIN-POT CLAVULANATE 875-125 MG PO TABS: 1.0000 | ORAL_TABLET | Freq: Two times a day (BID) | ORAL | 0 refills | Status: AC

## 2018-09-12 MED ORDER — VALSARTAN 160 MG PO TABS: 160.0000 mg | ORAL_TABLET | Freq: Every day | ORAL | 1 refills | Status: DC

## 2018-09-12 MED FILL — VALSARTAN 160 MG TABLET: 160 | 90 days supply | Qty: 90 | Fill #0

## 2018-09-12 MED FILL — AMOX-CLAV 875-125 MG TABLET: 875-125 | 7 days supply | Qty: 14 | Fill #0

## 2018-09-12 MED FILL — BENZONATATE 100 MG CAP: 100 | 5 days supply | Qty: 30 | Fill #0

## 2018-09-12 NOTE — Progress Notes (Signed)
Thank you for the details you included in the comment boxes. Those details are very helpful in determining the best course of treatment for you and help us to provide the best care.  We are sorry that you are not feeling well.  Here is how we plan to help!  Based on what you have shared with me it looks like you have sinusitis.  Sinusitis is inflammation and infection in the sinus cavities of the head.  Based on your presentation I believe you most likely have Acute Bacterial Sinusitis.  This is an infection caused by bacteria and is treated with antibiotics. I have prescribed Augmentin 875mg/125mg one tablet twice daily with food, for 7 days. You may use an oral decongestant such as Mucinex D or if you have glaucoma or high blood pressure use plain Mucinex. Saline nasal spray help and can safely be used as often as needed for congestion.  If you develop worsening sinus pain, fever or notice severe headache and vision changes, or if symptoms are not better after completion of antibiotic, please schedule an appointment with a health care provider.    I have also sent Tessalon Perles 100mg, take 1-2 every 8 hours as needed for cough.    Sinus infections are not as easily transmitted as other respiratory infection, however we still recommend that you avoid close contact with loved ones, especially the very young and elderly.  Remember to wash your hands thoroughly throughout the day as this is the number one way to prevent the spread of infection!  Home Care:  Only take medications as instructed by your medical team.  Complete the entire course of an antibiotic.  Do not take these medications with alcohol.  A steam or ultrasonic humidifier can help congestion.  You can place a towel over your head and breathe in the steam from hot water coming from a faucet.  Avoid close contacts especially the very young and the elderly.  Cover your mouth when you cough or sneeze.  Always remember to wash your  hands.  Get Help Right Away If:  You develop worsening fever or sinus pain.  You develop a severe head ache or visual changes.  Your symptoms persist after you have completed your treatment plan.  Make sure you  Understand these instructions.  Will watch your condition.  Will get help right away if you are not doing well or get worse.  Your e-visit answers were reviewed by a board certified advanced clinical practitioner to complete your personal care plan.  Depending on the condition, your plan could have included both over the counter or prescription medications.  If there is a problem please reply  once you have received a response from your provider.  Your safety is important to us.  If you have drug allergies check your prescription carefully.    You can use MyChart to ask questions about today's visit, request a non-urgent call back, or ask for a work or school excuse for 24 hours related to this e-Visit. If it has been greater than 24 hours you will need to follow up with your provider, or enter a new e-Visit to address those concerns.  You will get an e-mail in the next two days asking about your experience.  I hope that your e-visit has been valuable and will speed your recovery. Thank you for using e-visits.     

## 2018-09-16 ENCOUNTER — Ambulatory Visit (INDEPENDENT_AMBULATORY_CARE_PROVIDER_SITE_OTHER): Payer: Self-pay | Admitting: Nurse Practitioner

## 2018-09-16 VITALS — BP 112/80 | HR 111 | Temp 100.1°F | Resp 18 | Wt 151.1 lb

## 2018-09-16 DIAGNOSIS — J101 Influenza due to other identified influenza virus with other respiratory manifestations: Secondary | ICD-10-CM

## 2018-09-16 DIAGNOSIS — R509 Fever, unspecified: Secondary | ICD-10-CM

## 2018-09-16 LAB — POCT INFLUENZA A/B
Influenza A, POC: NEGATIVE
Influenza B, POC: POSITIVE — AB

## 2018-09-16 MED ORDER — PROMETHAZINE-DM 6.25-15 MG/5ML PO SYRP: 5.0000 mL | ORAL_SOLUTION | Freq: Four times a day (QID) | ORAL | 0 refills | Status: AC | PRN

## 2018-09-16 MED FILL — PROMETHAZINE W/DM SYRUP: 6.25-15 | 7 days supply | Qty: 140 | Fill #0

## 2018-09-16 NOTE — Progress Notes (Signed)
Subjective:     Cassadi Moffa is a 38 y.o. female who presents for evaluation of influenza like symptoms. Symptoms include chills, headache, myalgias, productive cough and fever.  Patient states her cough started about 2 weeks ago.  Patient states at that time she did an ED visit and was prescribed Augmentin.  Patient states she never had that prescription filled.  Patient states 5 days ago, she developed fever which has been persistent since that time.  Patient denies any relief from the cough and further denies nasal congestion, sore throat, abdominal pain, or headache.. She has tried to alleviate the symptoms with Mucinex, cough Perles, with minimal relief. High risk factors for influenza complications: co-morbid illness and Patient has a history of hypertension, hyperlipidemia, and an autoimmune disorder..  The following portions of the patient's history were reviewed and updated as appropriate: allergies, current medications and past medical history.  Review of Systems Constitutional: positive for chills, fatigue, fevers and malaise, negative for anorexia and sweats Eyes: negative Ears, nose, mouth, throat, and face: negative Respiratory: positive for cough, negative for asthma, chronic bronchitis, sputum, stridor and wheezing Cardiovascular: negative Gastrointestinal: negative Neurological: positive for headaches, negative for coordination problems, dizziness, gait problems, tremors and weakness     Objective:    BP 112/80 (BP Location: Right Arm, Patient Position: Sitting, Cuff Size: Normal)   Pulse (!) 111   Temp 100.1 F (37.8 C) (Oral)   Resp 18   Wt 151 lb 1.6 oz (68.5 kg)   SpO2 96%   BMI 28.55 kg/m  General appearance: alert, cooperative, fatigued and no distress Head: Normocephalic, without obvious abnormality, atraumatic Eyes: conjunctivae/corneas clear. PERRL, EOM's intact. Fundi benign. Ears: normal TM's and external ear canals both ears Nose: Nares normal. Septum  midline. Mucosa normal. No drainage or sinus tenderness. Throat: lips, mucosa, and tongue normal; teeth and gums normal Lungs: clear to auscultation bilaterally Heart: regular rate and rhythm, S1, S2 normal, no murmur, click, rub or gallop Abdomen: soft, non-tender; bowel sounds normal; no masses,  no organomegaly Pulses: 2+ and symmetric Skin: Skin color, texture, turgor normal. No rashes or lesions Lymph nodes: cervical and submandibular nodes normal Neurologic: Grossly normal    Assessment:    Influenza B   Plan:   Exam findings, diagnosis etiology and medication use and indications reviewed with patient. Follow- Up and discharge instructions provided. No emergent/urgent issues found on exam.  Based on the patient's clinical presentation, symptoms, and positive influenza test, patient was diagnosed with influenza B.  Based on the onset of the patient's symptoms, she is currently outside of the window to receive Tamiflu.  Patient was instructed to perform symptomatic treatment which include increase fluids, rest, and to remain home until fever free for 24 hours.  Patient was educated regarding how influenza can lead to pneumonia.  Instructed patient to be mindful of her symptoms and any worsening she may be experiencing.  If so patient will need to follow-up with her PCP.  Patient education was provided. Patient verbalized understanding of information provided and agrees with plan of care (POC), all questions answered. The patient is advised to call or return to clinic if condition does not see an improvement in symptoms, or to seek the care of the closest emergency department if condition worsens with the above plan.   1. Fever, unspecified fever cause  - POCT Influenza A/B  2. Influenza B  - promethazine-dextromethorphan (PROMETHAZINE-DM) 6.25-15 MG/5ML syrup; Take 5 mLs by mouth 4 (four) times daily as  needed for up to 7 days for cough.  Dispense: 140 mL; Refill: 0  -Take medication as  prescribed. -Ibuprofen or Tylenol for pain, fever, or general discomfort. -Increase fluids. -Sleep elevated on at least 2 pillows at bedtime to help with cough. -Use a humidifier or vaporizer when at home and during sleep to help with cough. -May use a teaspoon of honey or over-the-counter cough drops to help with cough. -Follow-up with your PCP if symptoms worsen.  If symptoms worsen rapidly, seek care immediately. -Remain out of work until fever free for at least 24 hours.  Work note was provided to return to work on Friday, September 19, 2018. -Follow-up if symptoms do not improve.

## 2018-09-16 NOTE — Patient Instructions (Signed)
Influenza, Adult  -Take medication as prescribed. -Ibuprofen or Tylenol for pain, fever, or general discomfort. -Increase fluids. -Sleep elevated on at least 2 pillows at bedtime to help with cough. -Use a humidifier or vaporizer when at home and during sleep to help with cough. -May use a teaspoon of honey or over-the-counter cough drops to help with cough. -Follow-up with your PCP if symptoms worsen.  If symptoms worsen rapidly, seek care immediately. -Remain out of work until fever free for at least 24 hours.  Work note was provided to return to work on Friday, September 19, 2018. -Follow-up if symptoms do not improve.   Influenza, more commonly known as "the flu," is a viral infection that mainly affects the respiratory tract. The respiratory tract includes organs that help you breathe, such as the lungs, nose, and throat. The flu causes many symptoms similar to the common cold along with high fever and body aches. The flu spreads easily from person to person (is contagious). Getting a flu shot (influenza vaccination) every year is the best way to prevent the flu. What are the causes? This condition is caused by the influenza virus. You can get the virus by:  Breathing in droplets that are in the air from an infected person's cough or sneeze.  Touching something that has been exposed to the virus (has been contaminated) and then touching your mouth, nose, or eyes. What increases the risk? The following factors may make you more likely to get the flu:  Not washing or sanitizing your hands often.  Having close contact with many people during cold and flu season.  Touching your mouth, eyes, or nose without first washing or sanitizing your hands.  Not getting a yearly (annual) flu shot. You may have a higher risk for the flu, including serious problems such as a lung infection (pneumonia), if you:  Are older than 65.  Are pregnant.  Have a weakened disease-fighting system (immune  system). You may have a weakened immune system if you: ? Have HIV or AIDS. ? Are undergoing chemotherapy. ? Are taking medicines that reduce (suppress) the activity of your immune system.  Have a long-term (chronic) illness, such as heart disease, kidney disease, diabetes, or lung disease.  Have a liver disorder.  Are severely overweight (morbidly obese).  Have anemia. This is a condition that affects your red blood cells.  Have asthma. What are the signs or symptoms? Symptoms of this condition usually begin suddenly and last 4-14 days. They may include:  Fever and chills.  Headaches, body aches, or muscle aches.  Sore throat.  Cough.  Runny or stuffy (congested) nose.  Chest discomfort.  Poor appetite.  Weakness or fatigue.  Dizziness.  Nausea or vomiting. How is this diagnosed? This condition may be diagnosed based on:  Your symptoms and medical history.  A physical exam.  Swabbing your nose or throat and testing the fluid for the influenza virus. How is this treated? If the flu is diagnosed early, you can be treated with medicine that can help reduce how severe the illness is and how long it lasts (antiviral medicine). This may be given by mouth (orally) or through an IV. Taking care of yourself at home can help relieve symptoms. Your health care provider may recommend:  Taking over-the-counter medicines.  Drinking plenty of fluids. In many cases, the flu goes away on its own. If you have severe symptoms or complications, you may be treated in a hospital. Follow these instructions at home: Activity  Rest as needed and get plenty of sleep.  Stay home from work or school as told by your health care provider. Unless you are visiting your health care provider, avoid leaving home until your fever has been gone for 24 hours without taking medicine. Eating and drinking  Take an oral rehydration solution (ORS). This is a drink that is sold at pharmacies and  retail stores.  Drink enough fluid to keep your urine pale yellow.  Drink clear fluids in small amounts as you are able. Clear fluids include water, ice chips, diluted fruit juice, and low-calorie sports drinks.  Eat bland, easy-to-digest foods in small amounts as you are able. These foods include bananas, applesauce, rice, lean meats, toast, and crackers.  Avoid drinking fluids that contain a lot of sugar or caffeine, such as energy drinks, regular sports drinks, and soda.  Avoid alcohol.  Avoid spicy or fatty foods. General instructions      Take over-the-counter and prescription medicines only as told by your health care provider.  Use a cool mist humidifier to add humidity to the air in your home. This can make it easier to breathe.  Cover your mouth and nose when you cough or sneeze.  Wash your hands with soap and water often, especially after you cough or sneeze. If soap and water are not available, use alcohol-based hand sanitizer.  Keep all follow-up visits as told by your health care provider. This is important. How is this prevented?   Get an annual flu shot. You may get the flu shot in late summer, fall, or winter. Ask your health care provider when you should get your flu shot.  Avoid contact with people who are sick during cold and flu season. This is generally fall and winter. Contact a health care provider if:  You develop new symptoms.  You have: ? Chest pain. ? Diarrhea. ? A fever.  Your cough gets worse.  You produce more mucus.  You feel nauseous or you vomit. Get help right away if:  You develop shortness of breath or difficulty breathing.  Your skin or nails turn a bluish color.  You have severe pain or stiffness in your neck.  You develop a sudden headache or sudden pain in your face or ear.  You cannot eat or drink without vomiting. Summary  Influenza, more commonly known as "the flu," is a viral infection that primarily affects your  respiratory tract.  Symptoms of the flu usually begin suddenly and last 4-14 days.  Getting an annual flu shot is the best way to prevent getting the flu.  Stay home from work or school as told by your health care provider. Unless you are visiting your health care provider, avoid leaving home until your fever has been gone for 24 hours without taking medicine.  Keep all follow-up visits as told by your health care provider. This is important. This information is not intended to replace advice given to you by your health care provider. Make sure you discuss any questions you have with your health care provider. Document Released: 08/17/2000 Document Revised: 02/05/2018 Document Reviewed: 02/05/2018 Elsevier Interactive Patient Education  2019 [employer].

## 2018-09-18 ENCOUNTER — Telehealth: Payer: Self-pay

## 2018-09-18 NOTE — Telephone Encounter (Signed)
Patient was not available.

## 2018-11-12 MED FILL — HYDROCHLOROTHIAZIDE 12.5 MG: 12.5 | 90 days supply | Qty: 90 | Fill #1

## 2018-11-12 MED FILL — AMLODIPINE BESYLATE 5 MG TA: 5 | 90 days supply | Qty: 90 | Fill #1

## 2018-11-20 ENCOUNTER — Other Ambulatory Visit (INDEPENDENT_AMBULATORY_CARE_PROVIDER_SITE_OTHER): Payer: 59

## 2018-11-20 DIAGNOSIS — R945 Abnormal results of liver function studies: Secondary | ICD-10-CM | POA: Diagnosis not present

## 2018-11-20 DIAGNOSIS — R7989 Other specified abnormal findings of blood chemistry: Secondary | ICD-10-CM

## 2018-11-20 LAB — HEPATIC FUNCTION PANEL
ALBUMIN: 4.7 g/dL (ref 3.5–5.2)
ALK PHOS: 90 U/L (ref 39–117)
ALT: 23 U/L (ref 0–35)
AST: 16 U/L (ref 0–37)
Bilirubin, Direct: 0.1 mg/dL (ref 0.0–0.3)
Total Bilirubin: 0.6 mg/dL (ref 0.2–1.2)
Total Protein: 8.6 g/dL — ABNORMAL HIGH (ref 6.0–8.3)

## 2018-11-26 ENCOUNTER — Telehealth: Payer: Self-pay | Admitting: Gastroenterology

## 2018-11-26 ENCOUNTER — Other Ambulatory Visit: Payer: Self-pay | Admitting: Gastroenterology

## 2018-11-26 DIAGNOSIS — K743 Primary biliary cirrhosis: Secondary | ICD-10-CM

## 2018-11-26 DIAGNOSIS — R945 Abnormal results of liver function studies: Principal | ICD-10-CM

## 2018-11-26 DIAGNOSIS — R7989 Other specified abnormal findings of blood chemistry: Secondary | ICD-10-CM

## 2018-11-26 NOTE — Telephone Encounter (Signed)
Patient is returning your call regarding lab results. °

## 2018-11-29 ENCOUNTER — Other Ambulatory Visit: Payer: Self-pay | Admitting: Family

## 2018-11-29 MED FILL — VALSARTAN 160 MG TABLET: 160 | 90 days supply | Qty: 90 | Fill #1

## 2018-12-01 MED FILL — ATORVASTATIN 40 MG TABLET: 40 | 90 days supply | Qty: 90 | Fill #0

## 2019-02-23 ENCOUNTER — Other Ambulatory Visit: Payer: Self-pay | Admitting: Family

## 2019-02-23 MED FILL — AMLODIPINE BESYLATE 5 MG TA: 5 | 90 days supply | Qty: 90 | Fill #0

## 2019-02-23 MED FILL — HYDROCHLOROTHIAZIDE 12.5 MG: 12.5 | 90 days supply | Qty: 90 | Fill #0

## 2019-03-06 MED FILL — VALSARTAN 320 MG TAB: 320 | 30 days supply | Qty: 15 | Fill #0

## 2019-03-15 ENCOUNTER — Telehealth: Payer: Self-pay | Admitting: Family

## 2019-03-15 MED ORDER — LOSARTAN POTASSIUM 50 MG PO TABS: 50.0000 mg | ORAL_TABLET | Freq: Every day | ORAL | 1 refills | Status: DC

## 2019-03-15 MED ORDER — VALSARTAN 160 MG PO TABS: 160.0000 mg | ORAL_TABLET | Freq: Every day | ORAL | 0 refills | Status: DC

## 2019-03-15 NOTE — Telephone Encounter (Signed)
See mychart.  

## 2019-03-16 MED FILL — LOSARTAN POTASSIUM 50 MG TA: 50 | 30 days supply | Qty: 30 | Fill #0

## 2019-03-26 MED FILL — LOSARTAN POTASSIUM 50 MG TA: 50 | 30 days supply | Qty: 30 | Fill #0

## 2019-04-10 ENCOUNTER — Ambulatory Visit: Payer: 59 | Admitting: Family

## 2019-04-22 ENCOUNTER — Encounter: Payer: Self-pay | Admitting: Gastroenterology

## 2019-04-22 MED FILL — LOSARTAN POTASSIUM 50 MG TA: 50 | 30 days supply | Qty: 30 | Fill #1

## 2019-05-08 ENCOUNTER — Ambulatory Visit: Payer: 59 | Admitting: Family

## 2019-06-02 ENCOUNTER — Other Ambulatory Visit (INDEPENDENT_AMBULATORY_CARE_PROVIDER_SITE_OTHER): Payer: 59

## 2019-06-02 ENCOUNTER — Ambulatory Visit: Payer: 59 | Admitting: Gastroenterology

## 2019-06-02 ENCOUNTER — Encounter: Payer: Self-pay | Admitting: Gastroenterology

## 2019-06-02 VITALS — BP 104/76 | HR 100 | Temp 98.7°F | Ht 60.5 in | Wt 156.0 lb

## 2019-06-02 DIAGNOSIS — K743 Primary biliary cirrhosis: Secondary | ICD-10-CM

## 2019-06-02 DIAGNOSIS — R945 Abnormal results of liver function studies: Secondary | ICD-10-CM | POA: Diagnosis not present

## 2019-06-02 DIAGNOSIS — R7989 Other specified abnormal findings of blood chemistry: Secondary | ICD-10-CM

## 2019-06-02 LAB — CBC WITH DIFFERENTIAL/PLATELET
Basophils Absolute: 0 10*3/uL (ref 0.0–0.1)
Basophils Relative: 0.5 % (ref 0.0–3.0)
Eosinophils Absolute: 0.9 10*3/uL — ABNORMAL HIGH (ref 0.0–0.7)
Eosinophils Relative: 8.7 % — ABNORMAL HIGH (ref 0.0–5.0)
HCT: 39 % (ref 36.0–46.0)
Hemoglobin: 13.2 g/dL (ref 12.0–15.0)
Lymphocytes Relative: 27.9 % (ref 12.0–46.0)
Lymphs Abs: 3 10*3/uL (ref 0.7–4.0)
MCHC: 33.9 g/dL (ref 30.0–36.0)
MCV: 87.5 fl (ref 78.0–100.0)
Monocytes Absolute: 0.6 10*3/uL (ref 0.1–1.0)
Monocytes Relative: 5.7 % (ref 3.0–12.0)
Neutro Abs: 6.1 10*3/uL (ref 1.4–7.7)
Neutrophils Relative %: 57.2 % (ref 43.0–77.0)
Platelets: 334 10*3/uL (ref 150.0–400.0)
RBC: 4.46 Mil/uL (ref 3.87–5.11)
RDW: 12.6 % (ref 11.5–15.5)
WBC: 10.6 10*3/uL — ABNORMAL HIGH (ref 4.0–10.5)

## 2019-06-02 LAB — COMPREHENSIVE METABOLIC PANEL
ALT: 13 U/L (ref 0–35)
AST: 14 U/L (ref 0–37)
Albumin: 4.5 g/dL (ref 3.5–5.2)
Alkaline Phosphatase: 95 U/L (ref 39–117)
BUN: 6 mg/dL (ref 6–23)
CO2: 23 mEq/L (ref 19–32)
Calcium: 9.6 mg/dL (ref 8.4–10.5)
Chloride: 101 mEq/L (ref 96–112)
Creatinine, Ser: 0.66 mg/dL (ref 0.40–1.20)
GFR: 99.95 mL/min (ref >60.00)
Glucose, Bld: 88 mg/dL (ref 70–99)
Potassium: 3.1 mEq/L — ABNORMAL LOW (ref 3.5–5.1)
Sodium: 138 mEq/L (ref 135–145)
Total Bilirubin: 0.4 mg/dL (ref 0.2–1.2)
Total Protein: 8.4 g/dL — ABNORMAL HIGH (ref 6.0–8.3)

## 2019-06-02 LAB — TSH: TSH: 1.4 u[IU]/mL (ref 0.35–4.50)

## 2019-06-02 LAB — PROTIME-INR
INR: 1.2 ratio — ABNORMAL HIGH (ref 0.8–1.0)
Prothrombin Time: 14.2 s — ABNORMAL HIGH (ref 9.6–13.1)

## 2019-06-02 NOTE — Patient Instructions (Addendum)
Your provider has requested that you go to the basement level for lab work before leaving today. Press "B" on the elevator. The lab is located at the first door on the left as you exit the elevator.  You will have a bone density scan done on 06/25/19 at 2pm. tou will go to the basement and go to the x-ray dpartment  Thank you for entrusting me with your care and choosing [facility].  Dr Ardis Hughs

## 2019-06-02 NOTE — Progress Notes (Signed)
Review of pertinent gastrointestinal problems: 1.Primary biliary cholangitis, presented with Elevated liver tests (intermittent alk phos and GGT elevations, low level,all other liver tests including coags and platelets were normal), asymptomatic: Lab workup 09/2017: Hepatitis A total antibody reactive,hepatitis B surface antigen negative, hepatitis B surface antibody reactive, hepatitis C antibody negative, iron studies normal, ANA negative, anti-smooth muscle antibody negative, alpha-1 antitrypsin normal, ceruloplasmin normal, TTG normal, IgA level normal. Antimitochondrial antibody very elevated.  Ultrasound 05/2017 normal  Referral to [facility] outpatient liver clinic: May 2019 visit with Arrie Aran Drazed: " She does not have indication to initiate treatment with ursodiol as her alkaline phosphatase is within normal limits" and she is asymptomatic. Follow up 3 month appt recommended.  Repeat labs September 2019 all liver tests completely normal including AST, ALT, alk phos and bilirubin.  Immune to hepatitis A and hepatitis B 2019 labs   HPI: This is a very pleasant 38 year old woman whom I last saw about a year ago.  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  She has had no significant pruritus.  No significant abdominal pains.  No overt GI bleeding.  No jaundice.  She has probably gained about 8 pounds since the pandemic started.  Chief complaint is primary biliary cholangitis  ROS: complete GI ROS as described in HPI, all other review negative.  Constitutional:  No unintentional weight loss   Past Medical History:  Diagnosis Date  . Arthritis   . Hypertension   . Migraine   . Primary biliary cirrhosis ([place])   . PVC (premature ventricular contraction)     Past Surgical History:  Procedure Laterality Date  . TUBAL LIGATION  2008    Current Outpatient Medications  Medication Sig Dispense Refill  . amLODipine (NORVASC) 5 MG  tablet TAKE 1 TABLET (5 MG TOTAL) BY MOUTH DAILY. 30 tablet 0  . atorvastatin (LIPITOR) 40 MG tablet TAKE 1 TABLET (40 MG TOTAL) BY MOUTH DAILY. PATIENT NEEDS OFFICE VISIT 90 tablet 0  . hydrochlorothiazide (MICROZIDE) 12.5 MG capsule TAKE 1 CAPSULE BY MOUTH DAILY. 90 capsule 0  . losartan (COZAAR) 50 MG tablet Take 1 tablet (50 mg total) by mouth daily. 30 tablet 1  . Multiple Vitamins-Minerals (MULTIVITAL) tablet Take 1 tablet by mouth daily. WITH POTASSIUM     No current facility-administered medications for this visit.     Allergies as of 06/02/2019  . (No Known Allergies)    Family History  Problem Relation Age of Onset  . Hyperlipidemia Mother   . Hypertension Mother   . Cancer Maternal Grandmother        lung  . Hyperlipidemia Maternal Grandmother   . Hypertension Maternal Grandmother   . Hyperlipidemia Maternal Grandfather   . Depression Daughter   . Hypertension Sister   . Hypertension Brother   . Colon cancer Neg Hx     Social History   Socioeconomic History  . Marital status: Married    Spouse name: Not on file  . Number of children: 3  . Years of education: Not on file  . Highest education level: Not on file  Occupational History  . Occupation: CMA  Social Needs  . Financial resource strain: Not on file  . Food insecurity    Worry: Not on file    Inability: Not on file  . Transportation needs    Medical: Not on file    Non-medical: Not on file  Tobacco Use  . Smoking status: Never  Smoker  . Smokeless tobacco: Never Used  Substance and Sexual Activity  . Alcohol use: No    Alcohol/week: 0.0 standard drinks  . Drug use: No  . Sexual activity: Not on file    Comment: tubal ligation  Lifestyle  . Physical activity    Days per week: Not on file    Minutes per session: Not on file  . Stress: Not on file  Relationships  . Social Herbalist on phone: Not on file    Gets together: Not on file    Attends religious service: Not on file     Active member of club or organization: Not on file    Attends meetings of clubs or organizations: Not on file    Relationship status: Not on file  . Intimate partner violence    Fear of current or ex partner: Not on file    Emotionally abused: Not on file    Physically abused: Not on file    Forced sexual activity: Not on file  Other Topics Concern  . 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 female with primary biliary cholangitis  No signs clinically of cirrhosis.  She needs repeat labs today including CBC, complete metabolic profile, vitamin A, vitamin D, coags and TSH.  Also bone density test.  She will need a bone density test every 2 years.  Blood work about every 6 months.  Return office visit with me in 1 year.  Please see the "Patient Instructions" section for addition details about the plan.  Owens Loffler, MD [facility] 06/02/2019, 2:22 PM

## 2019-06-05 ENCOUNTER — Ambulatory Visit (INDEPENDENT_AMBULATORY_CARE_PROVIDER_SITE_OTHER): Payer: 59 | Admitting: Family

## 2019-06-05 ENCOUNTER — Encounter: Payer: Self-pay | Admitting: Family

## 2019-06-05 ENCOUNTER — Other Ambulatory Visit: Payer: Self-pay

## 2019-06-05 VITALS — BP 117/84 | HR 98 | Temp 98.2°F | Resp 16 | Ht 65.5 in | Wt 155.8 lb

## 2019-06-05 DIAGNOSIS — I10 Essential (primary) hypertension: Secondary | ICD-10-CM

## 2019-06-05 DIAGNOSIS — E785 Hyperlipidemia, unspecified: Secondary | ICD-10-CM | POA: Diagnosis not present

## 2019-06-05 DIAGNOSIS — Z0000 Encounter for general adult medical examination without abnormal findings: Secondary | ICD-10-CM | POA: Diagnosis not present

## 2019-06-05 LAB — VITAMIN D 1,25 DIHYDROXY
Vitamin D 1, 25 (OH)2 Total: 74 pg/mL — ABNORMAL HIGH (ref 18–72)
Vitamin D2 1, 25 (OH)2: <8 pg/mL
Vitamin D3 1, 25 (OH)2: 74 pg/mL

## 2019-06-05 LAB — VITAMIN A: Vitamin A (Retinoic Acid): 35 ug/dL — ABNORMAL LOW (ref 38–98)

## 2019-06-05 MED ORDER — LOSARTAN POTASSIUM 50 MG PO TABS: 50.0000 mg | ORAL_TABLET | Freq: Every day | ORAL | 1 refills | Status: DC

## 2019-06-05 MED ORDER — ATORVASTATIN CALCIUM 40 MG PO TABS: ORAL_TABLET | ORAL | 1 refills | Status: DC

## 2019-06-05 MED ORDER — HYDROCHLOROTHIAZIDE 12.5 MG PO CAPS: 12.5000 mg | ORAL_CAPSULE | Freq: Every day | ORAL | 1 refills | Status: DC

## 2019-06-05 MED ORDER — AMLODIPINE BESYLATE 5 MG PO TABS: 5.0000 mg | ORAL_TABLET | Freq: Every day | ORAL | 1 refills | Status: DC

## 2019-06-05 MED FILL — ATORVASTATIN 40 MG TABLET: 40 | 90 days supply | Qty: 90 | Fill #0

## 2019-06-05 MED FILL — HYDROCHLOROTHIAZIDE 12.5 MG: 12.5 | 90 days supply | Qty: 90 | Fill #0

## 2019-06-05 MED FILL — LOSARTAN POTASSIUM 50 MG TA: 50 | 30 days supply | Qty: 30 | Fill #0

## 2019-06-05 MED FILL — AMLODIPINE BESYLATE 5 MG TA: 5 | 90 days supply | Qty: 90 | Fill #0

## 2019-06-05 NOTE — Patient Instructions (Signed)
Please complete lab work prior to leaving.   

## 2019-06-05 NOTE — Progress Notes (Signed)
Subjective:    Patient ID: Alyssa Berry, female    DOB: 31-Mar-1981, 38 y.o.   MRN: [id number]  HPI  Patient presents today for complete physical.  Immunizations: will get flu shot next week from employer Diet:trying to eat healthy Wt Readings from Last 3 Encounters:  06/05/19 155 lb 12.8 oz (70.7 kg)  06/02/19 156 lb (70.8 kg)  09/16/18 151 lb 1.6 oz (68.5 kg)  Exercise:some walking, stationary bike Pap Smear: 07/03/17- sees GYN Dental: up to date Vision: every february     Review of Systems  Constitutional: Negative for unexpected weight change.  HENT: Negative for hearing loss and rhinorrhea.   Eyes: Negative for visual disturbance.  Respiratory: Negative for cough and shortness of breath.   Cardiovascular: Negative for chest pain.  Gastrointestinal: Negative for blood in stool, constipation and diarrhea.  Genitourinary: Negative for dysuria, frequency and hematuria.  Musculoskeletal: Negative for arthralgias and myalgias.  Neurological: Positive for headaches (occasional headaches).  Hematological: Negative for adenopathy.  Psychiatric/Behavioral:       Denies depression/anxiety   Past Medical History:  Diagnosis Date  . Arthritis   . Hypertension   . Migraine   . Primary biliary cirrhosis ([place])   . PVC (premature ventricular contraction)      Social History   Socioeconomic History  . Marital status: Married    Spouse name: Not on file  . Number of children: 3  . Years of education: Not on file  . Highest education level: Not on file  Occupational History  . Occupation: CMA  Social Needs  . Financial resource strain: Not on file  . Food insecurity    Worry: Not on file    Inability: Not on file  . Transportation needs    Medical: Not on file    Non-medical: Not on file  Tobacco Use  . Smoking status: Never Smoker  . Smokeless tobacco: Never Used  Substance and Sexual Activity  . Alcohol use: No    Alcohol/week: 0.0 standard drinks  .  Drug use: No  . Sexual activity: Not on file    Comment: tubal ligation  Lifestyle  . Physical activity    Days per week: Not on file    Minutes per session: Not on file  . Stress: Not on file  Relationships  . Social Herbalist on phone: Not on file    Gets together: Not on file    Attends religious service: Not on file    Active member of club or organization: Not on file    Attends meetings of clubs or organizations: Not on file    Relationship status: Not on file  . Intimate partner violence    Fear of current or ex partner: Not on file    Emotionally abused: Not on file    Physically abused: Not on file    Forced sexual activity: Not on file  Other Topics Concern  . 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 TUBAL LIGATION  2008    Family History  Problem Relation Age of Onset  . Hyperlipidemia Mother   . Hypertension Mother   . Cancer Maternal Grandmother  lung  . Hyperlipidemia Maternal Grandmother   . Hypertension Maternal Grandmother   . Hyperlipidemia Maternal Grandfather   . Depression Daughter   . Hypertension Sister   . Hypertension Brother   . Colon cancer Neg Hx     No Known Allergies  Current Outpatient Medications on File Prior to Visit  Medication Sig Dispense Refill  . amLODipine (NORVASC) 5 MG tablet TAKE 1 TABLET (5 MG TOTAL) BY MOUTH DAILY. 30 tablet 0  . atorvastatin (LIPITOR) 40 MG tablet TAKE 1 TABLET (40 MG TOTAL) BY MOUTH DAILY. PATIENT NEEDS OFFICE VISIT 90 tablet 0  . hydrochlorothiazide (MICROZIDE) 12.5 MG capsule TAKE 1 CAPSULE BY MOUTH DAILY. 90 capsule 0  . losartan (COZAAR) 50 MG tablet Take 1 tablet (50 mg total) by mouth daily. 30 tablet 1  . Multiple Vitamins-Minerals  (MULTIVITAL) tablet Take 1 tablet by mouth daily. WITH POTASSIUM     No current facility-administered medications on file prior to visit.     BP 117/84 (BP Location: Right Arm, Patient Position: Sitting, Cuff Size: Small)   Pulse 98   Temp 98.2 F (36.8 C) (Temporal)   Resp 16   Ht 5' 5.5" (1.664 m)   Wt 155 lb 12.8 oz (70.7 kg)   LMP 05/22/2019 (Approximate)   SpO2 100%   BMI 25.53 kg/m        Objective:   Physical Exam  Physical Exam  Constitutional: She is oriented to person, place, and time. She appears well-developed and well-nourished. No distress.  HENT:  Head: Normocephalic and atraumatic.  Right Ear: Tympanic membrane and ear canal normal.  Left Ear: Tympanic membrane and ear canal normal.  Mouth/Throat: Not examined. Pt wearing mask for covid-19 precautions.  Eyes: Pupils are equal, round, and reactive to light. No scleral icterus.  Neck: Normal range of motion. No thyromegaly present.  Cardiovascular: Normal rate and regular rhythm.   No murmur heard. Pulmonary/Chest: Effort normal and breath sounds normal. No respiratory distress. He has no wheezes. She has no rales. She exhibits no tenderness.  Abdominal: Soft. Bowel sounds are normal. She exhibits no distension and no mass. There is no tenderness. There is no rebound and no guarding.  Musculoskeletal: She exhibits no edema.  Lymphadenopathy:    She has no cervical adenopathy.  Neurological: She is alert and oriented to person, place, and time. She has normal patellar reflexes. She exhibits normal muscle tone. Coordination normal.  Skin: Skin is warm and dry.  Psychiatric: She has a normal mood and affect. Her behavior is normal. Judgment and thought content normal.  Breast/pelvic: deferred to GYN           Assessment & Plan:    HTN-  BP Readings from Last 3 Encounters:  06/05/19 117/84  06/02/19 104/76  09/16/18 112/80   BP stable on current meds. Continue same.   Hyperlipidemia - obtain  follow up lipid panel. Continue statin.  Preventative care- tetanus up to date. Will get flu shot from employer.  Pap next year- sees GYN. Obtain routine lab work.  Discussed healthy diet and regular exercise.      Assessment & Plan:

## 2019-06-06 LAB — CBC WITH DIFFERENTIAL/PLATELET
Absolute Monocytes: 633 cells/uL (ref 200–950)
Basophils Absolute: 46 cells/uL (ref 0–200)
Basophils Relative: 0.4 %
Eosinophils Absolute: 1852 cells/uL — ABNORMAL HIGH (ref 15–500)
Eosinophils Relative: 16.1 %
HCT: 36.9 % (ref 35.0–45.0)
Hemoglobin: 12.5 g/dL (ref 11.7–15.5)
Lymphs Abs: 2795 cells/uL (ref 850–3900)
MCH: 29.2 pg (ref 27.0–33.0)
MCHC: 33.9 g/dL (ref 32.0–36.0)
MCV: 86.2 fL (ref 80.0–100.0)
MPV: 10.2 fL (ref 7.5–12.5)
Monocytes Relative: 5.5 %
Neutro Abs: 6176 cells/uL (ref 1500–7800)
Neutrophils Relative %: 53.7 %
Platelets: 353 10*3/uL (ref 140–400)
RBC: 4.28 10*6/uL (ref 3.80–5.10)
RDW: 12.3 % (ref 11.0–15.0)
Total Lymphocyte: 24.3 %
WBC: 11.5 10*3/uL — ABNORMAL HIGH (ref 3.8–10.8)

## 2019-06-06 LAB — BASIC METABOLIC PANEL
BUN: 7 mg/dL (ref 7–25)
CO2: 24 mmol/L (ref 20–32)
Calcium: 9.3 mg/dL (ref 8.6–10.2)
Chloride: 102 mmol/L (ref 98–110)
Creat: 0.6 mg/dL (ref 0.50–1.10)
Glucose, Bld: 82 mg/dL (ref 65–99)
Potassium: 3.1 mmol/L — ABNORMAL LOW (ref 3.5–5.3)
Sodium: 139 mmol/L (ref 135–146)

## 2019-06-06 LAB — LIPID PANEL
Cholesterol: 148 mg/dL (ref <200)
HDL: 39 mg/dL — ABNORMAL LOW (ref >=50)
LDL Cholesterol (Calc): 92 mg/dL (calc)
Non-HDL Cholesterol (Calc): 109 mg/dL (calc) (ref <130)
Total CHOL/HDL Ratio: 3.8 (calc) (ref <5.0)
Triglycerides: 77 mg/dL (ref <150)

## 2019-06-06 LAB — HEPATIC FUNCTION PANEL
AG Ratio: 1.4 (calc) (ref 1.0–2.5)
ALT: 15 U/L (ref 6–29)
AST: 17 U/L (ref 10–30)
Albumin: 4.5 g/dL (ref 3.6–5.1)
Alkaline phosphatase (APISO): 89 U/L (ref 31–125)
Bilirubin, Direct: 0.1 mg/dL (ref 0.0–0.2)
Globulin: 3.2 g/dL (calc) (ref 1.9–3.7)
Indirect Bilirubin: 0.3 mg/dL (calc) (ref 0.2–1.2)
Total Bilirubin: 0.4 mg/dL (ref 0.2–1.2)
Total Protein: 7.7 g/dL (ref 6.1–8.1)

## 2019-06-06 LAB — TSH: TSH: 1.11 mIU/L

## 2019-06-06 IMAGING — US US ABDOMEN COMPLETE
1 series · 14 of 25 positions shown · non-contrast
Comparison: None.

CLINICAL DATA: Elevated liver enzymes

EXAM:
ABDOMEN ULTRASOUND COMPLETE

[Series 1: us abdomen complete · 0.20mm/px · 14 of 110 slices shown]
[im 1/110]
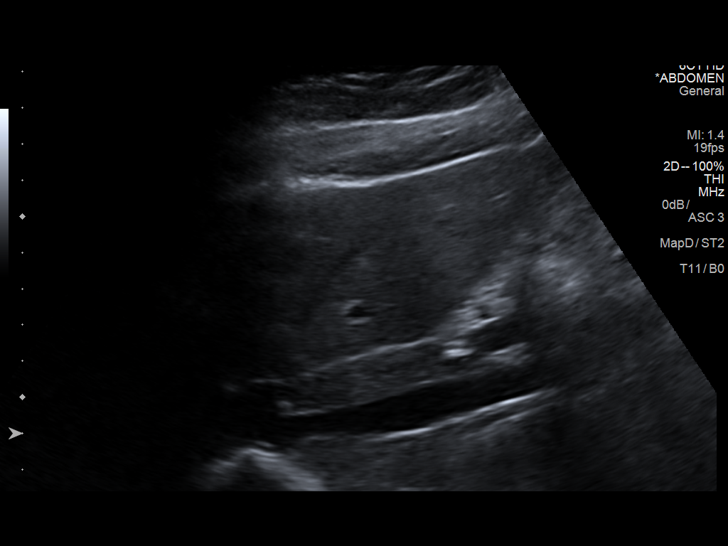
[im 10/110]
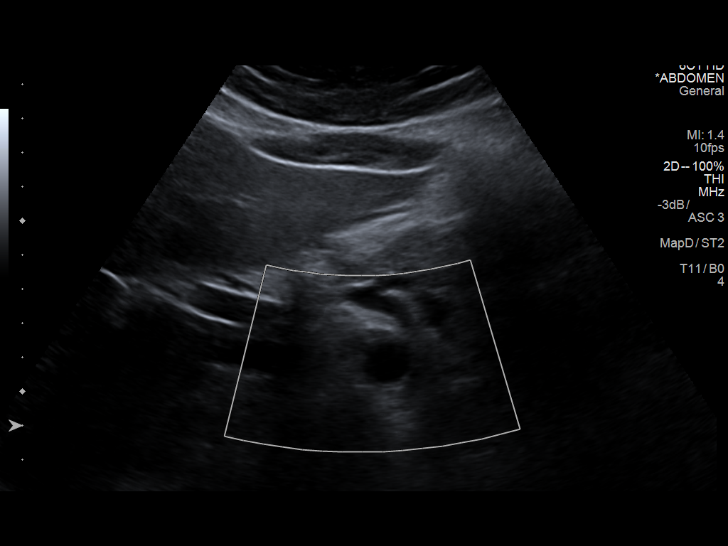
[im 19/110]
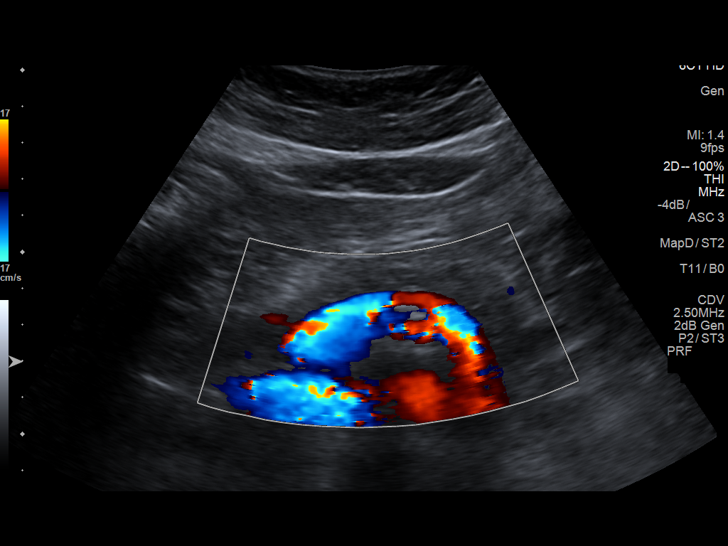
[im 28/110]
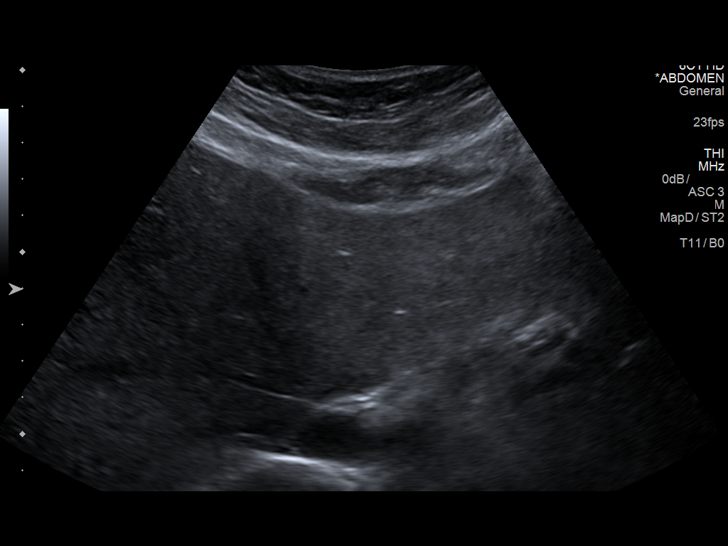
[im 37/110]
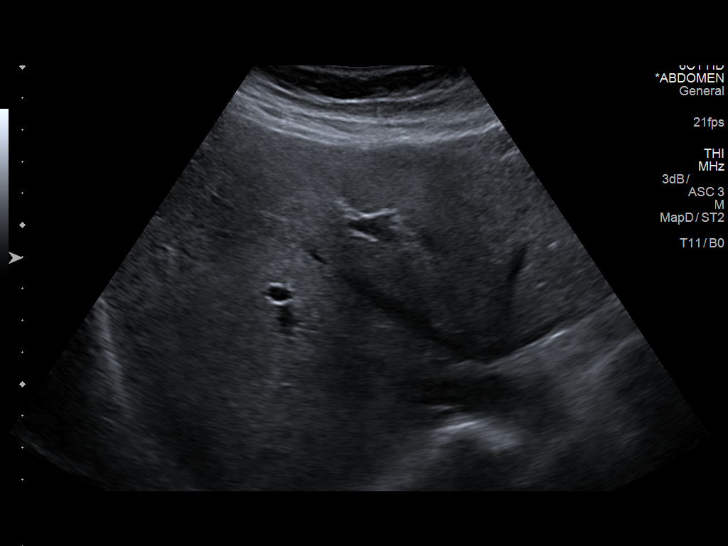
[im 41/110]
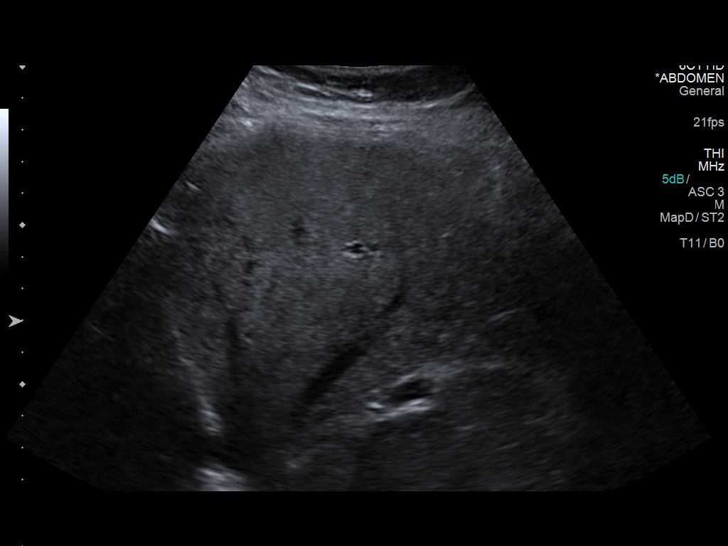
[im 50/110]
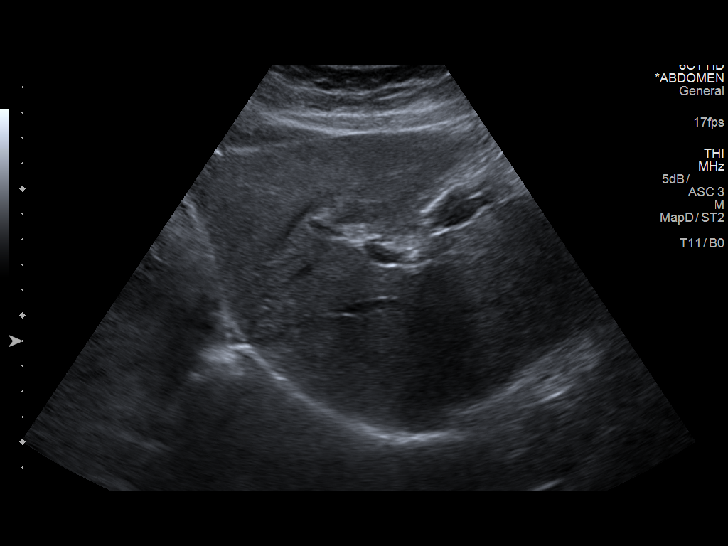
[im 60/110]
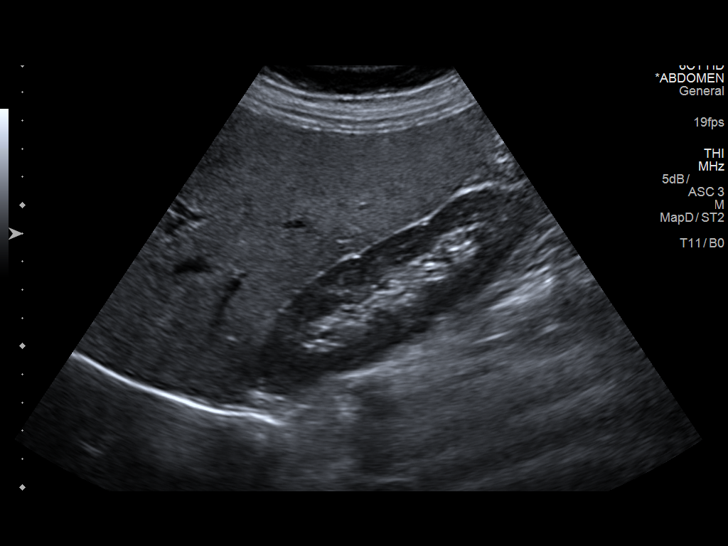
[im 69/110]
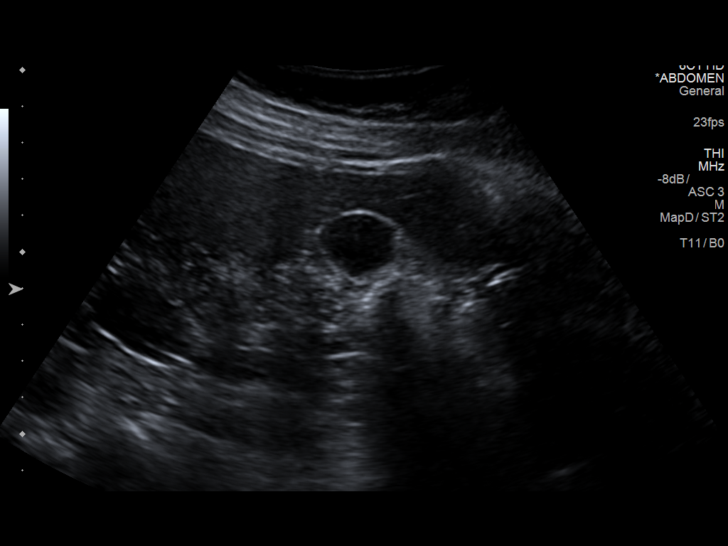
[im 73/110]
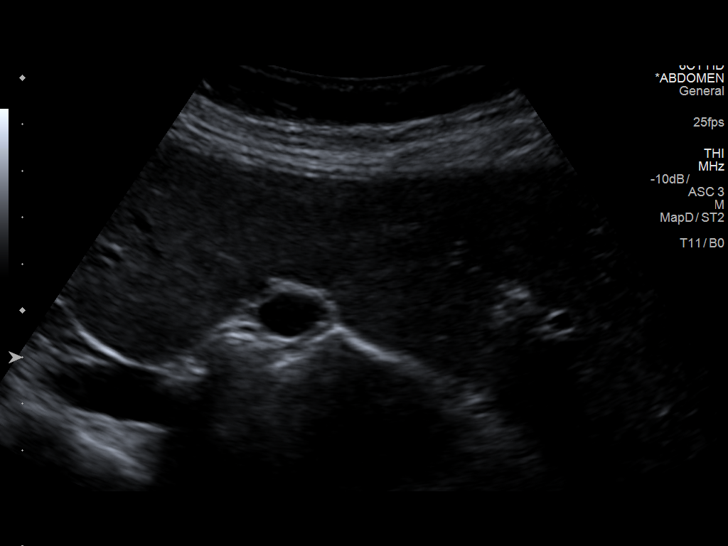
[im 82/110]
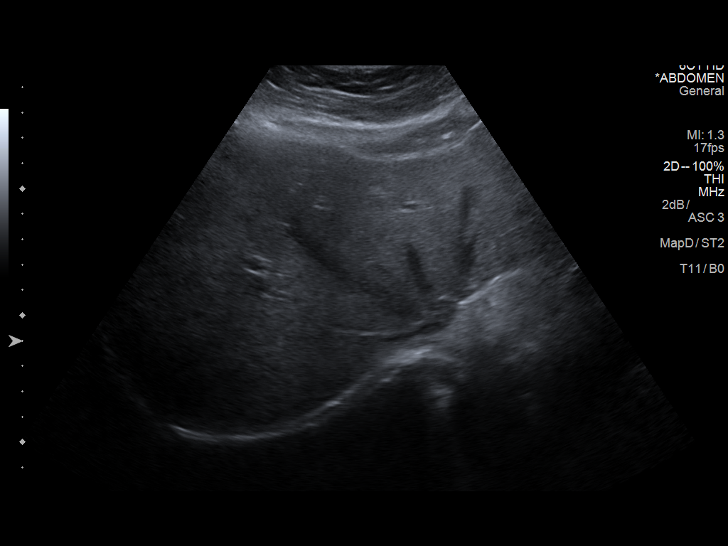
[im 91/110]
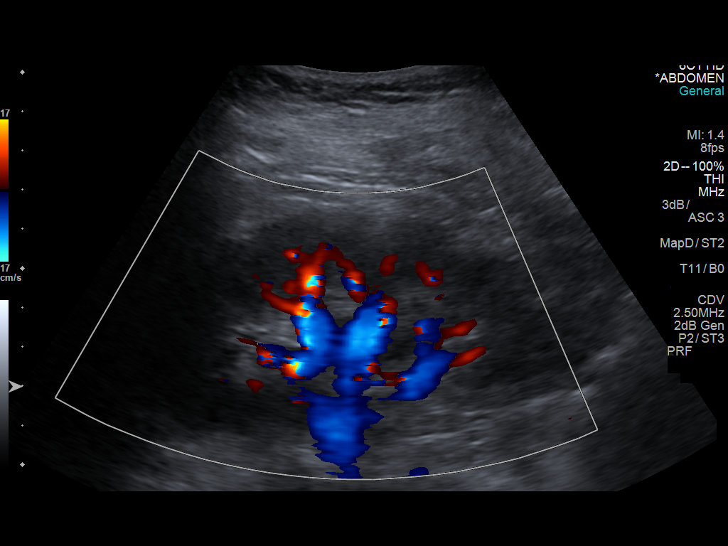
[im 100/110]
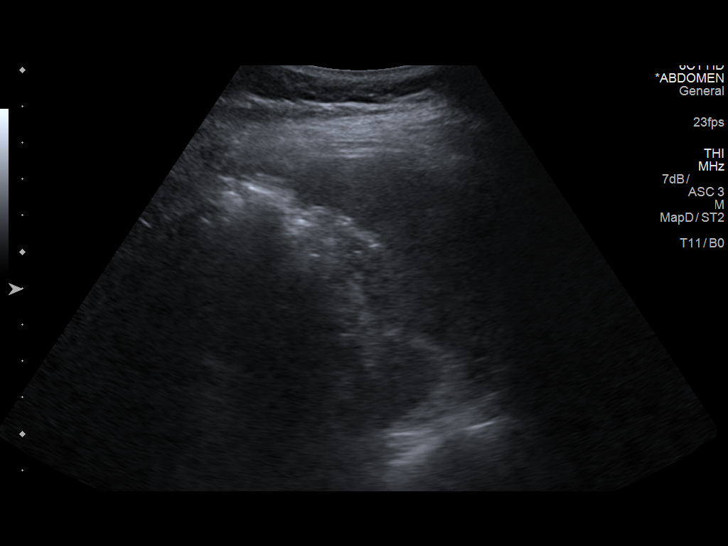
[im 110/110]
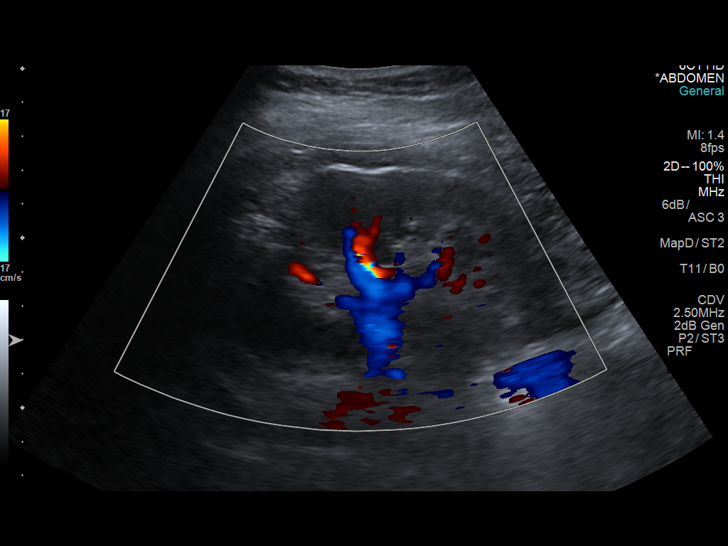

[14 of 25 positions shown; findings below may reference images not displayed]

FINDINGS: Gallbladder: No gallstones or wall thickening visualized. There is
no pericholecystic fluid. No sonographic Murphy sign noted by
sonographer.

Common bile duct: Diameter: 4 mm. There is no intrahepatic, common
hepatic, or common bile duct dilatation.

Liver: No focal lesion identified. Within normal limits in
parenchymal echogenicity. Portal vein is patent on color Doppler
imaging with normal direction of blood flow towards the liver.

IVC: No abnormality visualized.

Pancreas: No mass or inflammatory focus.

Spleen: Size and appearance within normal limits.

Right Kidney: Length: 11.7 cm. Echogenicity within normal limits. No
mass or hydronephrosis visualized.

Left Kidney: Length: 10.8 cm. Echogenicity within normal limits. No
mass or hydronephrosis visualized.

Abdominal aorta: No aneurysm visualized.

Other findings: No demonstrable ascites.
IMPRESSION: Study within normal limits.

## 2019-06-07 ENCOUNTER — Telehealth: Payer: 59 | Admitting: Family

## 2019-06-07 DIAGNOSIS — M546 Pain in thoracic spine: Secondary | ICD-10-CM | POA: Diagnosis not present

## 2019-06-07 MED ORDER — NAPROXEN 500 MG PO TABS: 500.0000 mg | ORAL_TABLET | Freq: Two times a day (BID) | ORAL | 0 refills | Status: DC

## 2019-06-07 MED ORDER — CYCLOBENZAPRINE HCL 10 MG PO TABS: 10.0000 mg | ORAL_TABLET | Freq: Three times a day (TID) | ORAL | 0 refills | Status: DC | PRN

## 2019-06-07 NOTE — Progress Notes (Signed)
We are sorry that you are not feeling well.  Here is how we plan to help!  Based on what you have shared with me it looks like you mostly have acute back pain.  Acute back pain is defined as musculoskeletal pain that can resolve in 1-3 weeks with conservative treatment.  I have prescribed Naprosyn 500 mg twice a day non-steroid anti-inflammatory (NSAID) as well as Flexeril 10 mg every eight hours as needed which is a muscle relaxer  Some patients experience stomach irritation or in increased heartburn with anti-inflammatory drugs.  Please keep in mind that muscle relaxer's can cause fatigue and should not be taken while at work or driving.  Back pain is very common.  The pain often gets better over time.  The cause of back pain is usually not dangerous.  Most people can learn to manage their back pain on their own.  Home Care  Stay active.  Start with short walks on flat ground if you can.  Try to walk farther each day.  Do not sit, drive or stand in one place for more than 30 minutes.  Do not stay in bed.  Do not avoid exercise or work.  Activity can help your back heal faster.  Be careful when you bend or lift an object.  Bend at your knees, keep the object close to you, and do not twist.  Sleep on a firm mattress.  Lie on your side, and bend your knees.  If you lie on your back, put a pillow under your knees.  Only take medicines as told by your doctor.  Put ice on the injured area.  Put ice in a plastic bag  Place a towel between your skin and the bag  Leave the ice on for 15-20 minutes, 3-4 times a day for the first 2-3 days. 210 After that, you can switch between ice and heat packs.  Ask your doctor about back exercises or massage.  Avoid feeling anxious or stressed.  Find good ways to deal with stress, such as exercise.  Get Help Right Way If:  Your pain does not go away with rest or medicine.  Your pain does not go away in 1 week.  You have new problems.  You do not  feel well.  The pain spreads into your legs.  You cannot control when you poop (bowel movement) or pee (urinate)  You feel sick to your stomach (nauseous) or throw up (vomit)  You have belly (abdominal) pain.  You feel like you may pass out (faint).  If you develop a fever.  Make Sure you:  Understand these instructions.  Will watch your condition  Will get help right away if you are not doing well or get worse.  Your e-visit answers were reviewed by a board certified advanced clinical practitioner to complete your personal care plan.  Depending on the condition, your plan could have included both over the counter or prescription medications.  If there is a problem please reply  once you have received a response from your provider.  Your safety is important to us.  If you have drug allergies check your prescription carefully.    You can use MyChart to ask questions about today's visit, request a non-urgent call back, or ask for a work or school excuse for 24 hours related to this e-Visit. If it has been greater than 24 hours you will need to follow up with your provider, or enter a new e-Visit to address   those concerns.  You will get an e-mail in the next two days asking about your experience.  I hope that your e-visit has been valuable and will speed your recovery. Thank you for using e-visits.   Greater than 5 minutes, yet less than 10 minutes of time have been spent researching, coordinating, and implementing care for this patient today.  Thank you for the details you included in the comment boxes. Those details are very helpful in determining the best course of treatment for you and help us to provide the best care.  

## 2019-06-08 ENCOUNTER — Telehealth: Payer: Self-pay | Admitting: Family

## 2019-06-08 ENCOUNTER — Other Ambulatory Visit: Payer: Self-pay

## 2019-06-08 DIAGNOSIS — E876 Hypokalemia: Secondary | ICD-10-CM

## 2019-06-08 MED ORDER — POTASSIUM CHLORIDE CRYS ER 20 MEQ PO TBCR: 20.0000 meq | EXTENDED_RELEASE_TABLET | Freq: Every day | ORAL | 3 refills | Status: DC

## 2019-06-08 MED FILL — POTASSIUM CHLORIDE CRYS ER: 20 | 30 days supply | Qty: 30 | Fill #0

## 2019-06-08 NOTE — Telephone Encounter (Signed)
Please contact pt and let her know that her potassium is low.  I would like her to add kdur. Take 2 tabs by mouth today, then one tablet once daily.  Repeat bmet in 1 week, dx hypokalemia. She may want to have this drawn at the [facility] office where she works.

## 2019-06-08 NOTE — Telephone Encounter (Signed)
Patient advised of results and provider's advise. She will recheck bmp in 1 wk at [facility], order entered.

## 2019-06-10 ENCOUNTER — Telehealth: Payer: Self-pay | Admitting: Gastroenterology

## 2019-06-10 NOTE — Telephone Encounter (Signed)
Pt sent mychart message per her request.

## 2019-06-15 ENCOUNTER — Other Ambulatory Visit (INDEPENDENT_AMBULATORY_CARE_PROVIDER_SITE_OTHER): Payer: 59

## 2019-06-15 DIAGNOSIS — E876 Hypokalemia: Secondary | ICD-10-CM

## 2019-06-15 LAB — BASIC METABOLIC PANEL
BUN: 10 mg/dL (ref 6–23)
CO2: 22 mEq/L (ref 19–32)
Calcium: 9.5 mg/dL (ref 8.4–10.5)
Chloride: 103 mEq/L (ref 96–112)
Creatinine, Ser: 0.65 mg/dL (ref 0.40–1.20)
GFR: 101.71 mL/min (ref >60.00)
Glucose, Bld: 100 mg/dL — ABNORMAL HIGH (ref 70–99)
Potassium: 3.5 mEq/L (ref 3.5–5.1)
Sodium: 138 mEq/L (ref 135–145)

## 2019-06-16 ENCOUNTER — Encounter: Payer: Self-pay | Admitting: Family

## 2019-06-16 MED ORDER — POTASSIUM CHLORIDE ER 10 MEQ PO TBCR: 20.0000 meq | EXTENDED_RELEASE_TABLET | Freq: Every day | ORAL | 5 refills | Status: DC

## 2019-06-16 MED FILL — POTASSIUM CL ER 10 MEQ TAB: 10 | 30 days supply | Qty: 60 | Fill #0

## 2019-06-25 ENCOUNTER — Other Ambulatory Visit: Payer: 59

## 2019-06-29 MED FILL — LOSARTAN POTASSIUM 50 MG TA: 50 | 30 days supply | Qty: 30 | Fill #1

## 2019-07-09 ENCOUNTER — Other Ambulatory Visit: Payer: Self-pay

## 2019-07-09 ENCOUNTER — Ambulatory Visit (INDEPENDENT_AMBULATORY_CARE_PROVIDER_SITE_OTHER)
Admission: RE | Admit: 2019-07-09 | Discharge: 2019-07-09 | Disposition: A | Discharge status: Home / Self Care | Payer: 59 | Source: Ambulatory Visit | Attending: Gastroenterology | Admitting: Gastroenterology

## 2019-07-09 DIAGNOSIS — R7989 Other specified abnormal findings of blood chemistry: Secondary | ICD-10-CM | POA: Diagnosis not present

## 2019-07-09 DIAGNOSIS — K743 Primary biliary cirrhosis: Secondary | ICD-10-CM

## 2019-08-17 MED FILL — LOSARTAN POTASSIUM 50 MG TA: 50 | 30 days supply | Qty: 30 | Fill #2

## 2019-08-17 MED FILL — POTASSIUM CL ER 10 MEQ TAB: 10 | 30 days supply | Qty: 60 | Fill #2

## 2019-09-07 MED FILL — AMLODIPINE BESYLATE 5 MG TA: 5 | 90 days supply | Qty: 90 | Fill #1

## 2019-09-07 MED FILL — HYDROCHLOROTHIAZIDE 12.5 MG: 12.5 | 90 days supply | Qty: 90 | Fill #1

## 2019-09-07 MED FILL — ATORVASTATIN 40 MG TABLET: 40 | 90 days supply | Qty: 90 | Fill #1

## 2019-09-14 MED FILL — LOSARTAN POTASSIUM 50 MG TA: 50 | 30 days supply | Qty: 30 | Fill #3

## 2019-10-07 MED FILL — POTASSIUM CL ER 10 MEQ TAB: 10 | 30 days supply | Qty: 60 | Fill #3

## 2019-10-07 MED FILL — LOSARTAN POTASSIUM 50 MG TA: 50 | 30 days supply | Qty: 30 | Fill #4

## 2019-11-01 MED FILL — POTASSIUM CHLORIDE CRYS ER: 20 | 30 days supply | Qty: 30 | Fill #1

## 2019-11-02 MED FILL — LOSARTAN POTASSIUM 50 MG TA: 50 | 30 days supply | Qty: 30 | Fill #5

## 2019-11-18 ENCOUNTER — Other Ambulatory Visit: Payer: Self-pay

## 2019-11-18 DIAGNOSIS — R7989 Other specified abnormal findings of blood chemistry: Secondary | ICD-10-CM

## 2019-11-18 DIAGNOSIS — K743 Primary biliary cirrhosis: Secondary | ICD-10-CM

## 2019-11-24 ENCOUNTER — Other Ambulatory Visit: Payer: Self-pay

## 2019-11-25 ENCOUNTER — Other Ambulatory Visit: Payer: Self-pay

## 2019-11-25 ENCOUNTER — Ambulatory Visit: Payer: 59 | Admitting: Family

## 2019-11-25 ENCOUNTER — Encounter: Payer: Self-pay | Admitting: Family

## 2019-11-25 VITALS — BP 124/90 | HR 100 | Temp 98.2°F | Resp 16 | Ht 61.0 in | Wt 158.0 lb

## 2019-11-25 DIAGNOSIS — I10 Essential (primary) hypertension: Secondary | ICD-10-CM | POA: Diagnosis not present

## 2019-11-25 DIAGNOSIS — E785 Hyperlipidemia, unspecified: Secondary | ICD-10-CM

## 2019-11-25 DIAGNOSIS — G43809 Other migraine, not intractable, without status migrainosus: Secondary | ICD-10-CM

## 2019-11-25 DIAGNOSIS — E876 Hypokalemia: Secondary | ICD-10-CM | POA: Diagnosis not present

## 2019-11-25 LAB — BASIC METABOLIC PANEL
BUN: 16 mg/dL (ref 6–23)
CO2: 24 mEq/L (ref 19–32)
Calcium: 8.8 mg/dL (ref 8.4–10.5)
Chloride: 104 mEq/L (ref 96–112)
Creatinine, Ser: 0.57 mg/dL (ref 0.40–1.20)
GFR: 118.08 mL/min (ref >60.00)
Glucose, Bld: 98 mg/dL (ref 70–99)
Potassium: 3.9 mEq/L (ref 3.5–5.1)
Sodium: 138 mEq/L (ref 135–145)

## 2019-11-25 MED ORDER — CYCLOBENZAPRINE HCL 10 MG PO TABS: 10.0000 mg | ORAL_TABLET | Freq: Three times a day (TID) | ORAL | 0 refills | Status: DC | PRN

## 2019-11-25 MED FILL — CYCLOBENZAPRINE HCL 10 MG T: 10 | 10 days supply | Qty: 30 | Fill #0

## 2019-11-25 NOTE — Patient Instructions (Signed)
Please complete lab work prior to leaving.   

## 2019-11-25 NOTE — Progress Notes (Signed)
Subjective:    Patient ID: Alyssa Berry, female    DOB: Apr 23, 1981, 39 y.o.   MRN: [id number]  HPI   Patient is a 39 yr old female who presents today for follow up.  HTN- maintained on amlodipine 5mg  and hctz 12.5, losartan 50mg   (reports that her bp is usually 118/80's at home).   BP Readings from Last 3 Encounters:  11/25/19 124/90  06/05/19 117/84  06/02/19 104/76   Hyperlipidemia- maintained on atorvastin. Denies myalgia.  Lab Results  Component Value Date   CHOL 148 06/05/2019   HDL 39 (L) 06/05/2019   LDLCALC 92 06/05/2019   TRIG 77 06/05/2019   CHOLHDL 3.8 06/05/2019   Hypokalemia- maintained on potassium.   Reports that her neck pain (from remote accident) leads to HA. Flexeril helps.     Review of Systems See HPI  Past Medical History:  Diagnosis Date  . Arthritis   . Hypertension   . Migraine   . Primary biliary cirrhosis (HCC)   . PVC (premature ventricular contraction)      Social History   Socioeconomic History  . Marital status: Married    Spouse name: Not on file  . Number of children: 3  . Years of education: Not on file  . Highest education level: Not on file  Occupational History  . Occupation: CMA  Tobacco Use  . Smoking status: Never Smoker  . Smokeless tobacco: Never Used  Substance and Sexual Activity  . Alcohol use: No    Alcohol/week: 0.0 standard drinks  . Drug use: No  . Sexual activity: Not on file    Comment: tubal ligation  Other Topics Concern  . 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 Difficulty of Paying Living Expenses:   Food Insecurity:   . Worried About [facility] in the Last Year:   . Mayra Reel in the Last Year:   Transportation Needs:   .  Programme researcher, broadcasting/film/video (Medical):   Barista Lack of Transportation (Non-Medical):   Physical Activity:   . Days of Exercise per Week:   . Minutes of Exercise per Session:   Stress:   . Feeling of Stress :   Social Connections:   . Frequency of Communication with Friends and Family:   . Frequency of Social Gatherings with Friends and Family:   . Attends Religious Services:   . Active Member of Clubs or Organizations:   . Attends Freight forwarder Meetings:   Marland Kitchen Marital Status:   Intimate Partner Violence:   . Fear of Current or Ex-Partner:   . Emotionally Abused:   Banker Physically Abused:   . Sexually Abused:     Past Surgical History:  Procedure Laterality Date  . TUBAL LIGATION  2008    Family History  Problem Relation Age of Onset  . Hyperlipidemia Mother   . Hypertension Mother   . Cancer Maternal Grandmother        lung  . Hyperlipidemia Maternal Grandmother   . Hypertension Maternal Grandmother   . Hyperlipidemia Maternal Grandfather   . Depression Daughter   . Hypertension Sister   . Hypertension Brother   . Colon  cancer Neg Hx     No Known Allergies  Current Outpatient Medications on File Prior to Visit  Medication Sig Dispense Refill  . amLODipine (NORVASC) 5 MG tablet Take 1 tablet (5 mg total) by mouth daily. 90 tablet 1  . atorvastatin (LIPITOR) 40 MG tablet TAKE 1 TABLET (40 MG TOTAL) BY MOUTH DAILY. PATIENT NEEDS OFFICE VISIT 90 tablet 1  . hydrochlorothiazide (MICROZIDE) 12.5 MG capsule Take 1 capsule (12.5 mg total) by mouth daily. 90 capsule 1  . losartan (COZAAR) 50 MG tablet Take 1 tablet (50 mg total) by mouth daily. 90 tablet 1  . Multiple Vitamins-Minerals (MULTIVITAL) tablet Take 1 tablet by mouth daily. WITH POTASSIUM    . potassium chloride SA (KLOR-CON) 20 MEQ tablet Take 1 tablet (20 mEq total) by mouth daily. 30 tablet 3   No current facility-administered medications on file prior to visit.    BP 124/90   Pulse 100   Temp 98.2 F (36.8 C)  (Temporal)   Resp 16   Ht 5\' 1"  (1.549 m)   Wt 158 lb (71.7 kg)   SpO2 100%   BMI 29.85 kg/m       Objective:   Physical Exam Constitutional:      Appearance: She is well-developed.  Neck:     Thyroid: No thyromegaly.  Cardiovascular:     Rate and Rhythm: Normal rate and regular rhythm.     Heart sounds: Normal heart sounds. No murmur.  Pulmonary:     Effort: Pulmonary effort is normal. No respiratory distress.     Breath sounds: Normal breath sounds. No wheezing.  Musculoskeletal:     Cervical back: Neck supple.  Skin:    General: Skin is warm and dry.  Neurological:     Mental Status: She is alert and oriented to person, place, and time.  Psychiatric:        Behavior: Behavior normal.        Thought Content: Thought content normal.        Judgment: Judgment normal.           Assessment & Plan:  HTN-  DBP mildly elevated today in the office.  She reports much better readings at home. I advised her to continue to monitor home BP's and call me if she has home readings >90. Continue current meds. Obtain follow up bmet.  Hypokalemia- tolerating Kdur. Reports GI upset with the 27mEQ tabs so she is now back on 44mEQ and tolerating.  Hyperlipidemia- maintained on statin. LDL at goal. Continue current dose.  Migraines- relieved with prn flexeril and excedrin. Flexeril refilled.   This visit occurred during the SARS-CoV-2 public health emergency.  Safety protocols were in place, including screening questions prior to the visit, additional usage of staff PPE, and extensive cleaning of exam room while observing appropriate contact time as indicated for disinfecting solutions.

## 2019-12-10 ENCOUNTER — Other Ambulatory Visit: Payer: Self-pay

## 2019-12-10 ENCOUNTER — Other Ambulatory Visit: Payer: Self-pay | Admitting: Family

## 2019-12-10 MED ORDER — AMLODIPINE BESYLATE 5 MG PO TABS: 5.0000 mg | ORAL_TABLET | Freq: Every day | ORAL | 1 refills | Status: DC

## 2019-12-10 MED FILL — POTASSIUM CHLORIDE CRYS ER: 20 | 30 days supply | Qty: 30 | Fill #2

## 2019-12-10 MED FILL — AMLODIPINE BESYLATE 5 MG TA: 5 | 30 days supply | Qty: 30 | Fill #0

## 2019-12-10 MED FILL — HYDROCHLOROTHIAZIDE 12.5 MG: 12.5 | 90 days supply | Qty: 90 | Fill #0

## 2019-12-21 ENCOUNTER — Other Ambulatory Visit (INDEPENDENT_AMBULATORY_CARE_PROVIDER_SITE_OTHER): Payer: 59

## 2019-12-21 DIAGNOSIS — K743 Primary biliary cirrhosis: Secondary | ICD-10-CM | POA: Diagnosis not present

## 2019-12-21 DIAGNOSIS — R7989 Other specified abnormal findings of blood chemistry: Secondary | ICD-10-CM

## 2019-12-21 LAB — CBC WITH DIFFERENTIAL/PLATELET
Basophils Absolute: 0 10*3/uL (ref 0.0–0.1)
Basophils Relative: 0.4 % (ref 0.0–3.0)
Eosinophils Absolute: 0.1 10*3/uL (ref 0.0–0.7)
Eosinophils Relative: 1.5 % (ref 0.0–5.0)
HCT: 39.7 % (ref 36.0–46.0)
Hemoglobin: 13.3 g/dL (ref 12.0–15.0)
Lymphocytes Relative: 23.4 % (ref 12.0–46.0)
Lymphs Abs: 2.2 10*3/uL (ref 0.7–4.0)
MCHC: 33.5 g/dL (ref 30.0–36.0)
MCV: 88.9 fl (ref 78.0–100.0)
Monocytes Absolute: 0.5 10*3/uL (ref 0.1–1.0)
Monocytes Relative: 5.5 % (ref 3.0–12.0)
Neutro Abs: 6.5 10*3/uL (ref 1.4–7.7)
Neutrophils Relative %: 69.2 % (ref 43.0–77.0)
Platelets: 317 10*3/uL (ref 150.0–400.0)
RBC: 4.47 Mil/uL (ref 3.87–5.11)
RDW: 12.4 % (ref 11.5–15.5)
WBC: 9.3 10*3/uL (ref 4.0–10.5)

## 2019-12-21 LAB — COMPREHENSIVE METABOLIC PANEL
ALT: 24 U/L (ref 0–35)
AST: 18 U/L (ref 0–37)
Albumin: 4.5 g/dL (ref 3.5–5.2)
Alkaline Phosphatase: 76 U/L (ref 39–117)
BUN: 11 mg/dL (ref 6–23)
CO2: 25 mEq/L (ref 19–32)
Calcium: 9.1 mg/dL (ref 8.4–10.5)
Chloride: 104 mEq/L (ref 96–112)
Creatinine, Ser: 0.53 mg/dL (ref 0.40–1.20)
GFR: 128.37 mL/min (ref >60.00)
Glucose, Bld: 94 mg/dL (ref 70–99)
Potassium: 3.8 mEq/L (ref 3.5–5.1)
Sodium: 139 mEq/L (ref 135–145)
Total Bilirubin: 0.3 mg/dL (ref 0.2–1.2)
Total Protein: 8.2 g/dL (ref 6.0–8.3)

## 2019-12-21 LAB — PROTIME-INR
INR: 1 ratio (ref 0.8–1.0)
Prothrombin Time: 11.2 s (ref 9.6–13.1)

## 2019-12-22 ENCOUNTER — Other Ambulatory Visit: Payer: Self-pay

## 2019-12-22 DIAGNOSIS — K743 Primary biliary cirrhosis: Secondary | ICD-10-CM

## 2020-01-11 ENCOUNTER — Other Ambulatory Visit: Payer: Self-pay | Admitting: Family

## 2020-01-27 MED FILL — POTASSIUM CL ER 10 MEQ TAB: 10 | 30 days supply | Qty: 60 | Fill #4

## 2020-03-07 MED FILL — HYDROCHLOROTHIAZIDE 12.5 MG: 12.5 | 90 days supply | Qty: 90 | Fill #1

## 2020-03-10 ENCOUNTER — Other Ambulatory Visit: Payer: Self-pay | Admitting: Family

## 2020-03-10 MED FILL — POTASSIUM CHLORIDE CRYS ER: 20 | 30 days supply | Qty: 30 | Fill #0

## 2020-04-07 MED FILL — AMLODIPINE BESYLATE 5 MG TA: 5 | 90 days supply | Qty: 90 | Fill #1

## 2020-04-07 MED FILL — POTASSIUM CHLORIDE CRYS ER: 20 | 30 days supply | Qty: 30 | Fill #1

## 2020-04-07 MED FILL — LOSARTAN POTASSIUM 50 MG TA: 50 | 90 days supply | Qty: 90 | Fill #1

## 2020-05-05 MED FILL — POTASSIUM CHLORIDE CRYS ER: 20 | 30 days supply | Qty: 30 | Fill #2

## 2020-06-08 ENCOUNTER — Other Ambulatory Visit: Payer: Self-pay | Admitting: Family

## 2020-06-08 MED FILL — HYDROCHLOROTHIAZIDE 12.5 MG: 12.5 | 90 days supply | Qty: 90 | Fill #0

## 2020-06-24 MED FILL — POTASSIUM CHLORIDE CRYS ER: 20 | 30 days supply | Qty: 30 | Fill #3

## 2020-07-21 ENCOUNTER — Other Ambulatory Visit: Payer: Self-pay | Admitting: Family

## 2020-07-21 MED FILL — AMLODIPINE BESYLATE 5 MG TA: 5 | 90 days supply | Qty: 90 | Fill #0

## 2020-07-23 ENCOUNTER — Telehealth: Payer: 59 | Admitting: Nurse Practitioner

## 2020-07-23 DIAGNOSIS — M545 Low back pain, unspecified: Secondary | ICD-10-CM

## 2020-07-23 MED ORDER — CYCLOBENZAPRINE HCL 10 MG PO TABS: 10.0000 mg | ORAL_TABLET | Freq: Three times a day (TID) | ORAL | 0 refills | Status: DC | PRN

## 2020-07-23 MED ORDER — NAPROXEN 500 MG PO TABS: 500.0000 mg | ORAL_TABLET | Freq: Two times a day (BID) | ORAL | 0 refills | Status: DC

## 2020-07-23 NOTE — Progress Notes (Signed)

## 2020-07-25 ENCOUNTER — Other Ambulatory Visit: Payer: Self-pay | Admitting: Family

## 2020-07-25 MED FILL — LOSARTAN POTASSIUM 50 MG TA: 50 | 90 days supply | Qty: 90 | Fill #0

## 2020-08-05 ENCOUNTER — Encounter: Payer: 59 | Admitting: Family

## 2020-08-08 ENCOUNTER — Other Ambulatory Visit: Payer: Self-pay | Admitting: Family

## 2020-08-08 MED FILL — ATORVASTATIN 40 MG TABLET: 40 | 90 days supply | Qty: 90 | Fill #0

## 2020-08-15 ENCOUNTER — Other Ambulatory Visit: Payer: Self-pay | Admitting: Family

## 2020-08-15 ENCOUNTER — Other Ambulatory Visit: Payer: Self-pay | Admitting: *Deleted

## 2020-08-15 MED ORDER — ATORVASTATIN CALCIUM 40 MG PO TABS: 40.0000 mg | ORAL_TABLET | Freq: Every day | ORAL | 1 refills | Status: DC

## 2020-08-24 ENCOUNTER — Other Ambulatory Visit: Payer: Self-pay

## 2020-08-24 ENCOUNTER — Ambulatory Visit (INDEPENDENT_AMBULATORY_CARE_PROVIDER_SITE_OTHER): Payer: 59 | Admitting: Family

## 2020-08-24 ENCOUNTER — Encounter: Payer: Self-pay | Admitting: Family

## 2020-08-24 VITALS — BP 124/78 | HR 90 | Resp 16 | Ht 61.0 in | Wt 160.0 lb

## 2020-08-24 DIAGNOSIS — E785 Hyperlipidemia, unspecified: Secondary | ICD-10-CM | POA: Diagnosis not present

## 2020-08-24 DIAGNOSIS — I10 Essential (primary) hypertension: Secondary | ICD-10-CM

## 2020-08-24 DIAGNOSIS — G43909 Migraine, unspecified, not intractable, without status migrainosus: Secondary | ICD-10-CM

## 2020-08-24 DIAGNOSIS — Z0000 Encounter for general adult medical examination without abnormal findings: Secondary | ICD-10-CM | POA: Diagnosis not present

## 2020-08-24 LAB — COMPREHENSIVE METABOLIC PANEL
ALT: 20 U/L (ref 0–35)
AST: 16 U/L (ref 0–37)
Albumin: 4.2 g/dL (ref 3.5–5.2)
Alkaline Phosphatase: 68 U/L (ref 39–117)
BUN: 11 mg/dL (ref 6–23)
CO2: 26 mEq/L (ref 19–32)
Calcium: 8.6 mg/dL (ref 8.4–10.5)
Chloride: 105 mEq/L (ref 96–112)
Creatinine, Ser: 0.58 mg/dL (ref 0.40–1.20)
GFR: 113.73 mL/min (ref >60.00)
Glucose, Bld: 96 mg/dL (ref 70–99)
Potassium: 3.9 mEq/L (ref 3.5–5.1)
Sodium: 139 mEq/L (ref 135–145)
Total Bilirubin: 0.4 mg/dL (ref 0.2–1.2)
Total Protein: 7.2 g/dL (ref 6.0–8.3)

## 2020-08-24 LAB — LIPID PANEL
Cholesterol: 190 mg/dL (ref 0–200)
HDL: 47.1 mg/dL (ref >39.00)
LDL Cholesterol: 112 mg/dL — ABNORMAL HIGH (ref 0–99)
NonHDL: 143.26
Total CHOL/HDL Ratio: 4
Triglycerides: 156 mg/dL — ABNORMAL HIGH (ref 0.0–149.0)
VLDL: 31.2 mg/dL (ref 0.0–40.0)

## 2020-08-24 NOTE — Patient Instructions (Signed)
Please complete lab work prior to leaving. Work on healthy diet, exercise, weight loss.   

## 2020-08-24 NOTE — Progress Notes (Signed)
Subjective:    Patient ID: Alyssa Berry, female    DOB: [birth year], 39 y.o.   MRN: [id number]  HPI  Patient presents today for complete physical.  Immunizations: up to date Diet:needs improvement. She is moving.  Wt Readings from Last 3 Encounters:  08/24/20 160 lb (72.6 kg)  11/25/19 158 lb (71.7 kg)  06/05/19 155 lb 12.8 oz (70.7 kg)  Exercise: not regularly  Pap Smear: scheduled next wednesday Vision/dental: up to date  HTN- maintained on losartan 50mg , amlodipine 5mg , hctz 12.5 mg.  BP Readings from Last 3 Encounters:  08/24/20 124/78  11/25/19 124/90  06/05/19 117/84   Hyperlipidemia- maintained on atorvastatin 40mg .   Lab Results  Component Value Date   CHOL 148 06/05/2019   HDL 39 (L) 06/05/2019   LDLCALC 92 06/05/2019   TRIG 77 06/05/2019   CHOLHDL 3.8 06/05/2019   Uses flexeril prn migraine or excedrine- has about 2 migraines a month.   Review of Systems  Constitutional: Negative for unexpected weight change.  HENT: Negative for hearing loss and rhinorrhea.   Eyes: Negative for visual disturbance.  Respiratory: Negative for cough and shortness of breath.   Cardiovascular: Negative for chest pain.  Gastrointestinal: Negative for blood in stool, constipation and diarrhea.  Genitourinary: Negative for dysuria, frequency, hematuria and menstrual problem.  Musculoskeletal: Negative for arthralgias and myalgias.  Skin: Negative for rash.  Neurological: Positive for headaches (migraines 2x a month).  Hematological: Negative for adenopathy.  Psychiatric/Behavioral:       Denies depression/anxiety   Past Medical History:  Diagnosis Date  . Arthritis   . Hypertension   . Migraine   . Primary biliary cirrhosis (HCC)   . PVC (premature ventricular contraction)      Social History   Socioeconomic History  . Marital status: Married    Spouse name: Not on file  . Number of children: 3  . Years of education: Not on file  . Highest education level:  Not on file  Occupational History  . Occupation: CMA  Tobacco Use  . Smoking status: Never Smoker  . Smokeless tobacco: Never Used  Vaping Use  . Vaping Use: Never used  Substance and Sexual Activity  . Alcohol use: No    Alcohol/week: 0.0 standard drinks  . Drug use: No  . Sexual activity: Not on file    Comment: tubal ligation  Other Topics Concern  . 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 TUBAL LIGATION  2008    Family History  Problem Relation Age of Onset  . Hyperlipidemia Mother   . Hypertension Mother   . Cancer Maternal Grandmother        lung  . Hyperlipidemia Maternal Grandmother   . Hypertension Maternal Grandmother   . Hyperlipidemia Maternal Grandfather   . Depression Daughter   . Hypertension Sister   . Hypertension Brother   . Colon cancer Neg  Hx     No Known Allergies  Current Outpatient Medications on File Prior to Visit  Medication Sig Dispense Refill  . amLODipine (NORVASC) 5 MG tablet Take 1 tablet (5 mg total) by mouth daily. 90 tablet 1  . atorvastatin (LIPITOR) 40 MG tablet Take 1 tablet (40 mg total) by mouth daily. 90 tablet 1  . cyclobenzaprine (FLEXERIL) 10 MG tablet Take 1 tablet (10 mg total) by mouth 3 (three) times daily as needed for muscle spasms. 30 tablet 0  . hydrochlorothiazide (MICROZIDE) 12.5 MG capsule TAKE 1 CAPSULE BY MOUTH ONCE DAILY 90 capsule 1  . losartan (COZAAR) 50 MG tablet Take 1 tablet (50 mg total) by mouth daily.  90 tablet 0  . Multiple Vitamins-Minerals (MULTIVITAL) tablet Take 1 tablet by mouth daily. WITH POTASSIUM    . potassium chloride SA (KLOR-CON) 20 MEQ tablet TAKE 1 TABLET (20 MEQ TOTAL) BY MOUTH DAILY. 30 tablet 3   No current facility-administered medications on file prior to visit.    BP 124/78 (BP Location: Left Arm, Patient Position: Sitting, Cuff Size: Normal)   Pulse 90   Resp 16   Ht 5\' 1"  (1.549 m)   Wt 160 lb (72.6 kg)   LMP 08/15/2020 (Exact Date)   SpO2 99%   BMI 30.23 kg/m       Objective:   Physical Exam   Physical Exam  Constitutional: She is oriented to person, place, and time. She appears well-developed and well-nourished. No distress.  HENT:  Head: Normocephalic and atraumatic.  Right Ear: Tympanic membrane and ear canal normal.  Left Ear: Tympanic membrane and ear canal normal.  Mouth/Throat: Not examined- pt wearning mask Eyes: Pupils are equal, round, and reactive to light. No scleral icterus.  Neck: Normal range of motion. No thyromegaly present.  Cardiovascular: Normal rate and regular rhythm.   No murmur heard. Pulmonary/Chest: Effort normal and breath sounds normal. No respiratory distress. He has no wheezes. She has no rales. She exhibits no tenderness.  Abdominal: Soft. Bowel sounds are normal. She exhibits no distension and no mass. There is no tenderness. There is no rebound and no guarding.  Musculoskeletal: She exhibits no edema.  Lymphadenopathy:    She has no cervical adenopathy.  Neurological: She is alert and oriented to person, place, and time. She has normal patellar reflexes. She exhibits normal muscle tone. Coordination normal.  Skin: Skin is warm and dry.  Psychiatric: She has a normal mood and affect. Her behavior is normal. Judgment and thought content normal.  Breast/pelvic: deferred to GYN          Assessment & Plan:   Preventative care- discussed healthy diet, exercise and weight loss. Pap will be updated by gyn.  Labs as  ordered.  Immunizations reviewed and up to date.   HTN- bp stable. Continue losartan 50mg , amlodipine 5mg , hctz 12.5 mg. Obtain follow up cmet.   Hyperlipidemia- tolerating atorvastatin 40mg  once daily. Continue same- obtain lipid panel.  Migraines- stable with prn use of flexeril or excedrin.   This visit occurred during the SARS-CoV-2 public health emergency.  Safety protocols were in place, including screening questions prior to the visit, additional usage of staff PPE, and extensive cleaning of exam room while observing appropriate contact time as indicated for disinfecting solutions.           Assessment & Plan:

## 2020-08-29 ENCOUNTER — Encounter: Payer: Self-pay | Admitting: Gastroenterology

## 2020-08-29 ENCOUNTER — Ambulatory Visit: Payer: 59 | Admitting: Gastroenterology

## 2020-08-29 VITALS — BP 110/68 | HR 89 | Ht 61.0 in | Wt 154.0 lb

## 2020-08-29 DIAGNOSIS — K743 Primary biliary cirrhosis: Secondary | ICD-10-CM

## 2020-08-29 NOTE — Patient Instructions (Addendum)
If you are age 39 or older, your body mass index should be between 23-30. Your Body mass index is 29.1 kg/m. If this is out of the aforementioned range listed, please consider follow up with your Primary Care Provider.  If you are age 41 or younger, your body mass index should be between 19-25. Your Body mass index is 29.1 kg/m. If this is out of the aformentioned range listed, please consider follow up with your Primary Care Provider.   Follow up appointment and labs in 1 year.  Thank you for choosing me and  Gastroenterology.  Rob Bunting , MD

## 2020-08-29 NOTE — Progress Notes (Signed)
Review of pertinent gastrointestinal problems: 1.Primary biliary cholangitis, presented withElevated liver tests (intermittent alk phos and GGT elevations, low level,all other liver tests including coags and platelets were normal), asymptomatic: Lab workup 09/2017: Hepatitis A total antibody reactive,hepatitis B surface antigen negative, hepatitis B surface antibody reactive, hepatitis C antibody negative, iron studies normal, ANA negative, anti-smooth muscle antibody negative, alpha-1 antitrypsin normal, ceruloplasmin normal, TTG normal, IgA level normal. Antimitochondrial antibody very elevated.  Ultrasound 9/2018normal  Referral to [facility] outpatient liver clinic:May 2019 visit withDawn Drazek: "She does not have indication to initiate treatment with ursodiol as her alkaline phosphatase is within normal limits"and she is asymptomatic. Follow up 3 month appt recommended.  Repeat labs September 2019all liver tests completely normal including AST, ALT, alk phos and bilirubin.    Immune to hepatitis A and hepatitis B 2019 labs  Bone density November 2020 was normal   HPI: This is a very pleasant 39 year old woman  I last saw her a little over a year ago.  She was actually doing quite well.  No pruritus, no abdominal pains, no jaundice.  I repeated blood work on her and a bone densitometry.    Blood work December 2021 showed completely normal liver tests  Her weight is down 2 pounds since her last office visit here in 2020  She is here today for primary biliary cholangitis follow-up  She feels quite well overall.  No significant abdominal pains, no jaundice.  She does have intermittent itchy skin but this seems fleeting and usually resolves with lotion   ROS: complete GI ROS as described in HPI, all other review negative.  Constitutional:  No unintentional weight loss   Past Medical History:  Diagnosis Date  . Arthritis   . Hypertension   . Migraine   .  Primary biliary cirrhosis ([place])   . PVC (premature ventricular contraction)     Past Surgical History:  Procedure Laterality Date  . TUBAL LIGATION  2008    Current Outpatient Medications  Medication Sig Dispense Refill  . amLODipine (NORVASC) 5 MG tablet Take 1 tablet (5 mg total) by mouth daily. 90 tablet 1  . atorvastatin (LIPITOR) 40 MG tablet Take 1 tablet (40 mg total) by mouth daily. 90 tablet 1  . cyclobenzaprine (FLEXERIL) 10 MG tablet Take 1 tablet (10 mg total) by mouth 3 (three) times daily as needed for muscle spasms. 30 tablet 0  . hydrochlorothiazide (MICROZIDE) 12.5 MG capsule TAKE 1 CAPSULE BY MOUTH ONCE DAILY 90 capsule 1  . losartan (COZAAR) 50 MG tablet Take 1 tablet (50 mg total) by mouth daily. 90 tablet 0  . Multiple Vitamins-Minerals (MULTIVITAL) tablet Take 1 tablet by mouth daily. WITH POTASSIUM    . potassium chloride SA (KLOR-CON) 20 MEQ tablet TAKE 1 TABLET (20 MEQ TOTAL) BY MOUTH DAILY. 30 tablet 3   No current facility-administered medications for this visit.    Allergies as of 08/29/2020  . (No Known Allergies)    Family History  Problem Relation Age of Onset  . Hyperlipidemia Mother   . Hypertension Mother   . Cancer Maternal Grandmother        lung  . Hyperlipidemia Maternal Grandmother   . Hypertension Maternal Grandmother   . Hyperlipidemia Maternal Grandfather   . Depression Daughter   . Hypertension Sister   . Hypertension Brother   . Colon cancer Neg Hx     Social History   Socioeconomic History  . Marital status: Married    Spouse name:  Not on file  . Number of children: 3  . Years of education: Not on file  . Highest education level: Not on file  Occupational History  . Occupation: CMA  Tobacco Use  . Smoking status: Never Smoker  . Smokeless tobacco: Never Used  Vaping Use  . Vaping Use: Never used  Substance and Sexual Activity  . Alcohol use: No    Alcohol/week: 0.0 standard drinks  . Drug use: No  . Sexual  activity: Not on file    Comment: tubal ligation  Other Topics Concern  . 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)   Completed associates degree   Enjoys drawing   Social Determinants of Health   Financial Resource Strain: Not on file  Food Insecurity: Not on file  Transportation Needs: Not on file  Physical Activity: Not on file  Stress: Not on file  Social Connections: Not on file  Intimate Partner Violence: Not on file     Physical Exam: BP 110/68   Pulse 89   Ht 5' 1" (1.549 m)   Wt 154 lb (69.9 kg)   LMP 08/15/2020 (Exact Date)   BMI 29.10 kg/m  Constitutional: generally well-appearing Psychiatric: alert and oriented x3 Abdomen: soft, nontender, nondistended, no obvious ascites, no peritoneal signs, normal bowel sounds No peripheral edema noted in lower extremities  Assessment and plan: 39 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  I do not think she needs any testing at this point since she just had a set of liver tests 2 or 3 weeks ago.  I would like to see her again in 1 year.  She will have a repeat set of labs including a CBC, complete metabolic profile and coags shortly before then.  She knows that if her liver tests are checked prior to then and are found to be significantly elevated I would like to hear about that.  Please see the "Patient Instructions" section for addition details about the plan.  Owens Loffler, MD [facility] 08/29/2020, 8:58 AM   Total time on date of encounter was 25 minutes (this included time spent preparing to see the patient reviewing records; obtaining and/or reviewing separately obtained history; performing a medically appropriate exam and/or evaluation; counseling and educating the patient  and family if present; ordering medications, tests or procedures if applicable; and documenting clinical information in the health record).

## 2020-09-03 MED FILL — HYDROCHLOROTHIAZIDE 12.5 MG: 12.5 | 90 days supply | Qty: 90 | Fill #1

## 2020-09-15 DIAGNOSIS — Z01419 Encounter for gynecological examination (general) (routine) without abnormal findings: Secondary | ICD-10-CM | POA: Diagnosis not present

## 2020-09-15 DIAGNOSIS — Z6830 Body mass index (BMI) 30.0-30.9, adult: Secondary | ICD-10-CM | POA: Diagnosis not present

## 2020-09-15 DIAGNOSIS — Z1389 Encounter for screening for other disorder: Secondary | ICD-10-CM | POA: Diagnosis not present

## 2020-09-15 DIAGNOSIS — Z130 Encounter for screening for diseases of the blood and blood-forming organs and certain disorders involving the immune mechanism: Secondary | ICD-10-CM | POA: Diagnosis not present

## 2020-09-15 DIAGNOSIS — N920 Excessive and frequent menstruation with regular cycle: Secondary | ICD-10-CM | POA: Diagnosis not present

## 2020-09-30 ENCOUNTER — Other Ambulatory Visit: Payer: Self-pay | Admitting: Family

## 2020-09-30 ENCOUNTER — Other Ambulatory Visit: Payer: Self-pay | Admitting: Family Medicine

## 2020-09-30 MED FILL — POTASSIUM CHLORIDE CRYS ER: 20 | 30 days supply | Qty: 30 | Fill #0

## 2020-10-10 ENCOUNTER — Other Ambulatory Visit (HOSPITAL_COMMUNITY): Payer: Self-pay | Admitting: Obstetrics and Gynecology

## 2020-10-10 MED FILL — LEVONOR-ETH ESTRAD 0.1-0.02: 0.1-20 | 28 days supply | Qty: 28 | Fill #0

## 2020-10-21 MED FILL — ATORVASTATIN 40 MG TABLET: 40 | 90 days supply | Qty: 90 | Fill #0

## 2020-10-21 MED FILL — AMLODIPINE BESYLATE 5 MG TA: 5 | 90 days supply | Qty: 90 | Fill #1

## 2020-10-27 MED FILL — POTASSIUM CHLORIDE CRYS ER: 20 | 30 days supply | Qty: 30 | Fill #1

## 2020-10-31 ENCOUNTER — Other Ambulatory Visit: Payer: Self-pay | Admitting: Family

## 2020-10-31 MED FILL — LOSARTAN POTASSIUM 50 MG TA: 50 | 30 days supply | Qty: 30 | Fill #0

## 2020-11-02 MED FILL — LEVONOR-ETH ESTRAD 0.1-0.02: 0.1-20 | 28 days supply | Qty: 28 | Fill #1

## 2020-11-21 MED FILL — POTASSIUM CHLORIDE CRYS ER: 20 | 30 days supply | Qty: 30 | Fill #2

## 2020-11-24 ENCOUNTER — Other Ambulatory Visit (HOSPITAL_BASED_OUTPATIENT_CLINIC_OR_DEPARTMENT_OTHER): Payer: Self-pay

## 2020-12-02 MED FILL — POTASSIUM CHLORIDE CRYS ER: 20 | 30 days supply | Qty: 30 | Fill #2

## 2020-12-02 MED FILL — LOSARTAN POTASSIUM 50 MG TA: 50 | 30 days supply | Qty: 30 | Fill #1

## 2020-12-02 MED FILL — LEVONOR-ETH ESTRAD 0.1-0.02: 0.1-20 | 28 days supply | Qty: 28 | Fill #2

## 2020-12-20 ENCOUNTER — Other Ambulatory Visit: Payer: Self-pay | Admitting: Family

## 2020-12-21 ENCOUNTER — Other Ambulatory Visit (HOSPITAL_COMMUNITY): Payer: Self-pay

## 2020-12-21 MED ORDER — HYDROCHLOROTHIAZIDE 12.5 MG PO CAPS
12.5000 mg | ORAL_CAPSULE | Freq: Every day | ORAL | 1 refills | Status: DC
  Filled 2020-12-21: qty 90, 90d supply, fill #0
  Filled 2021-02-06: qty 90, 90d supply, fill #1

## 2020-12-31 ENCOUNTER — Other Ambulatory Visit (HOSPITAL_COMMUNITY): Payer: Self-pay

## 2020-12-31 MED ORDER — ATORVASTATIN CALCIUM 40 MG PO TABS
40.0000 mg | ORAL_TABLET | Freq: Every day | ORAL | 1 refills | Status: DC
  Filled 2020-12-31: qty 90, 90d supply, fill #0
  Filled 2021-02-06 – 2021-05-07 (×2): qty 90, 90d supply, fill #1

## 2021-01-01 MED FILL — Losartan Potassium Tab 50 MG: ORAL | 30 days supply | Qty: 30 | Fill #0 | Status: AC

## 2021-01-01 MED FILL — Levonorgestrel & Ethinyl Estradiol Tab 0.1 MG-20 MCG: ORAL | 28 days supply | Qty: 28 | Fill #0 | Status: AC

## 2021-01-02 ENCOUNTER — Other Ambulatory Visit (HOSPITAL_COMMUNITY): Payer: Self-pay

## 2021-01-05 ENCOUNTER — Other Ambulatory Visit (HOSPITAL_COMMUNITY): Payer: Self-pay

## 2021-01-11 ENCOUNTER — Other Ambulatory Visit (HOSPITAL_COMMUNITY): Payer: Self-pay

## 2021-01-20 ENCOUNTER — Other Ambulatory Visit (HOSPITAL_COMMUNITY): Payer: Self-pay

## 2021-02-06 ENCOUNTER — Other Ambulatory Visit: Payer: Self-pay

## 2021-02-06 ENCOUNTER — Other Ambulatory Visit: Payer: Self-pay | Admitting: Family

## 2021-02-06 MED FILL — Potassium Chloride Microencapsulated Crys ER Tab 20 mEq: ORAL | 30 days supply | Qty: 30 | Fill #0 | Status: AC

## 2021-02-07 ENCOUNTER — Other Ambulatory Visit (HOSPITAL_COMMUNITY): Payer: Self-pay

## 2021-02-07 MED ORDER — LOSARTAN POTASSIUM 50 MG PO TABS
ORAL_TABLET | Freq: Every day | ORAL | 1 refills | Status: DC
  Filled 2021-02-07: qty 90, 90d supply, fill #0
  Filled 2021-05-07: qty 90, 90d supply, fill #1

## 2021-02-07 MED ORDER — AMLODIPINE BESYLATE 5 MG PO TABS
ORAL_TABLET | Freq: Every day | ORAL | 1 refills | Status: DC
  Filled 2021-02-07: qty 90, 90d supply, fill #0
  Filled 2021-05-07: qty 90, 90d supply, fill #1

## 2021-02-07 MED ORDER — LEVONORGESTREL-ETHINYL ESTRAD 0.1-20 MG-MCG PO TABS
ORAL_TABLET | ORAL | 3 refills | Status: DC
  Filled 2021-02-07: qty 84, 84d supply, fill #0

## 2021-02-08 ENCOUNTER — Other Ambulatory Visit (HOSPITAL_COMMUNITY): Payer: Self-pay

## 2021-02-08 ENCOUNTER — Telehealth: Payer: 59 | Admitting: Family

## 2021-02-08 DIAGNOSIS — J029 Acute pharyngitis, unspecified: Secondary | ICD-10-CM

## 2021-02-08 DIAGNOSIS — J069 Acute upper respiratory infection, unspecified: Secondary | ICD-10-CM

## 2021-02-08 MED ORDER — FLUTICASONE PROPIONATE 50 MCG/ACT NA SUSP
2.0000 | Freq: Every day | NASAL | 6 refills | Status: DC
  Filled 2021-02-08: qty 16, 30d supply, fill #0

## 2021-02-08 NOTE — Progress Notes (Signed)

## 2021-02-09 ENCOUNTER — Other Ambulatory Visit (HOSPITAL_COMMUNITY): Payer: Self-pay

## 2021-02-10 ENCOUNTER — Other Ambulatory Visit (HOSPITAL_COMMUNITY): Payer: Self-pay

## 2021-02-17 ENCOUNTER — Ambulatory Visit: Payer: 59 | Admitting: Family

## 2021-02-18 ENCOUNTER — Telehealth: Payer: 59 | Admitting: Nurse Practitioner

## 2021-02-18 DIAGNOSIS — J0100 Acute maxillary sinusitis, unspecified: Secondary | ICD-10-CM | POA: Diagnosis not present

## 2021-02-18 MED ORDER — AMOXICILLIN-POT CLAVULANATE 875-125 MG PO TABS: 1.0000 | ORAL_TABLET | Freq: Two times a day (BID) | ORAL | 0 refills | Status: DC

## 2021-02-18 NOTE — Progress Notes (Signed)

## 2021-03-07 ENCOUNTER — Ambulatory Visit: Payer: 59 | Admitting: Family

## 2021-03-14 ENCOUNTER — Ambulatory Visit: Payer: 59 | Admitting: Family

## 2021-03-14 ENCOUNTER — Other Ambulatory Visit: Payer: Self-pay

## 2021-03-14 ENCOUNTER — Telehealth: Payer: Self-pay | Admitting: Family

## 2021-03-14 ENCOUNTER — Other Ambulatory Visit (HOSPITAL_COMMUNITY): Payer: Self-pay

## 2021-03-14 DIAGNOSIS — Z3040 Encounter for surveillance of contraceptives, unspecified: Secondary | ICD-10-CM | POA: Diagnosis not present

## 2021-03-14 DIAGNOSIS — I10 Essential (primary) hypertension: Secondary | ICD-10-CM

## 2021-03-14 DIAGNOSIS — E785 Hyperlipidemia, unspecified: Secondary | ICD-10-CM

## 2021-03-14 DIAGNOSIS — N920 Excessive and frequent menstruation with regular cycle: Secondary | ICD-10-CM | POA: Diagnosis not present

## 2021-03-14 LAB — COMPREHENSIVE METABOLIC PANEL
ALT: 19 U/L (ref 0–35)
AST: 16 U/L (ref 0–37)
Albumin: 4.2 g/dL (ref 3.5–5.2)
Alkaline Phosphatase: 68 U/L (ref 39–117)
BUN: 11 mg/dL (ref 6–23)
CO2: 23 mEq/L (ref 19–32)
Calcium: 8.9 mg/dL (ref 8.4–10.5)
Chloride: 105 mEq/L (ref 96–112)
Creatinine, Ser: 0.67 mg/dL (ref 0.40–1.20)
GFR: 109.42 mL/min (ref >60.00)
Glucose, Bld: 86 mg/dL (ref 70–99)
Potassium: 4.1 mEq/L (ref 3.5–5.1)
Sodium: 140 mEq/L (ref 135–145)
Total Bilirubin: 0.6 mg/dL (ref 0.2–1.2)
Total Protein: 7.2 g/dL (ref 6.0–8.3)

## 2021-03-14 LAB — LIPID PANEL
Cholesterol: 150 mg/dL (ref 0–200)
HDL: 41.3 mg/dL (ref >39.00)
LDL Cholesterol: 75 mg/dL (ref 0–99)
NonHDL: 108.35
Total CHOL/HDL Ratio: 4
Triglycerides: 167 mg/dL — ABNORMAL HIGH (ref 0.0–149.0)
VLDL: 33.4 mg/dL (ref 0.0–40.0)

## 2021-03-14 MED ORDER — LO LOESTRIN FE 1 MG-10 MCG / 10 MCG PO TABS
1.0000 | ORAL_TABLET | Freq: Every day | ORAL | 1 refills | Status: DC
  Filled 2021-03-14: qty 84, 84d supply, fill #0
  Filled 2021-06-12: qty 84, 84d supply, fill #1

## 2021-03-14 NOTE — Patient Instructions (Signed)
Stop HCTZ, Stop Kdur.

## 2021-03-14 NOTE — Progress Notes (Signed)
Subjective:   By signing my name below, I, Shehryar Baig, attest that this documentation has been prepared under the direction and in the presence of Debbrah Alar NP. 03/14/2021    Patient ID: Alyssa Berry, female    DOB: 1980/11/21, 40 y.o.   MRN: [id number]  Chief Complaint  Patient presents with   Hypertension   Hyperlipidemia   Follow-up    HPI Patient is in today for a office visit.  Hypertension- Her blood pressure is low during this visit. She continues taking 5 mg amlodipine daily PO and 50 mg losartan daily PO and 12.5 mg hydrochlorothiazide daily PO and reports doing well while taking them. She does not know if her potasium levels were low before taking 12.5 hydrochlorothiazide daily PO.  BP Readings from Last 3 Encounters:  03/14/21 100/80  08/29/20 110/68  08/24/20 124/78   Cholesterol- She continues taking 40 mg Lipitor daily PO and reports doing well while taking it.   Lab Results  Component Value Date   CHOL 190 08/24/2020   HDL 47.10 08/24/2020   LDLCALC 112 (H) 08/24/2020   TRIG 156.0 (H) 08/24/2020   CHOLHDL 4 08/24/2020    Health Maintenance Due  Topic Date Due   HIV Screening  Never done   COVID-19 Vaccine (3 - Booster for Pf[employer] series) 05/12/2020   PAP SMEAR-Modifier  07/03/2020    Past Medical History:  Diagnosis Date   Arthritis    Hypertension    Migraine    Primary biliary cirrhosis (HCC)    PVC (premature ventricular contraction)     Past Surgical History:  Procedure Laterality Date   TUBAL LIGATION  2008    Family History  Problem Relation Age of Onset   Hyperlipidemia Mother    Hypertension Mother    Cancer Maternal Grandmother        lung   Hyperlipidemia Maternal Grandmother    Hypertension Maternal Grandmother    Hyperlipidemia Maternal Grandfather    Depression Daughter    Hypertension Sister    Hypertension Brother    Colon cancer Neg Hx     Social History   Socioeconomic History   Marital  status: Married    Spouse name: Not on file   Number of children: 3   Years of education: Not on file   Highest education level: Not on file  Occupational History   Occupation: CMA  Tobacco Use   Smoking status: Never   Smokeless tobacco: Never  Vaping Use   Vaping Use: Never used  Substance and Sexual Activity   Alcohol use: No    Alcohol/week: 0.0 standard drinks   Drug use: No   Sexual activity: Not on file    Comment: tubal ligation  Other Topics Concern   Not on file  Social History Narrative   Married   3 Children   2002- daughter Theadore Nan   2004- Cathryn   2008Lorayne Marek   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 90 tablet  1   atorvastatin (LIPITOR) 40 MG tablet Take 1 tablet (40 mg total) by mouth daily. 90 tablet 1   cyclobenzaprine (FLEXERIL) 10 MG tablet Take 1 tablet (10 mg total) by mouth 3 (three) times daily as needed for muscle spasms. 30 tablet 0   fluticasone (FLONASE) 50 MCG/ACT nasal spray Place 2 sprays into both nostrils daily. 16 g 6   levonorgestrel-ethinyl estradiol (ALESSE) 0.1-20 MG-MCG tablet Take 1 tablet by mouth every day 84 tablet 3   losartan (COZAAR) 50 MG tablet TAKE 1 TABLET BY MOUTH ONCE A DAY 90 tablet 1   Multiple Vitamins-Minerals (MULTIVITAL) tablet Take 1 tablet by mouth daily. WITH POTASSIUM     hydrochlorothiazide (MICROZIDE) 12.5 MG capsule Take 1 capsule (12.5 mg total) by mouth daily. 90 capsule 1   potassium chloride SA (KLOR-CON) 20 MEQ tablet TAKE 1 TABLET BY MOUTH ONCE  A DAY 30 tablet 3   amoxicillin-clavulanate (AUGMENTIN) 875-125 MG tablet Take 1 tablet by mouth 2 (two) times daily. 14 tablet 0   atorvastatin (LIPITOR) 40 MG tablet TAKE 1 TABLET BY MOUTH ONCE A DAY 90 tablet 1   levonorgestrel-ethinyl estradiol (ALESSE) 0.1-20 MG-MCG tablet TAKE 1 TABLET BY MOUTH DAILY 28 tablet 3   No facility-administered medications prior to visit.    No Known Allergies  ROS     Objective:    Physical Exam Constitutional:      General: She is not in acute distress.    Appearance: Normal appearance. She is not ill-appearing.  HENT:     Head: Normocephalic and atraumatic.     Right Ear: External ear normal.     Left Ear: External ear normal.  Eyes:     Extraocular Movements: Extraocular movements intact.     Pupils: Pupils are equal, round, and reactive to light.  Cardiovascular:     Rate and Rhythm: Normal rate and regular rhythm.     Pulses: Normal pulses.     Heart sounds: Normal heart sounds. No murmur heard.   No gallop.  Pulmonary:     Effort: Pulmonary effort is normal. No respiratory distress.     Breath sounds: Normal breath sounds. No wheezing, rhonchi or rales.  Skin:    General: Skin is warm and dry.  Neurological:     Mental Status: She is alert and oriented to person, place, and time.  Psychiatric:        Behavior: Behavior normal.    BP 100/80 (BP Location: Left Arm, Patient Position: Sitting, Cuff Size: Normal)   Pulse 97   Temp 99.3 F (37.4 C) (Oral)   Resp 18   Ht $R'5\' 1"'dW$  (1.549 m)   Wt 159 lb 9.6 oz (72.4 kg)   SpO2 98%   BMI 30.16 kg/m  Wt Readings from Last 3 Encounters:  03/14/21 159 lb 9.6 oz (72.4 kg)  08/29/20 154 lb (69.9 kg)  08/24/20 160 lb (72.6 kg)       Assessment & Plan:   Problem List Items Addressed This Visit       Unprioritized   Hyperlipidemia    Lab Results  Component Value Date   CHOL 190 08/24/2020   HDL 47.10 08/24/2020   LDLCALC 112 (H) 08/24/2020   TRIG 156.0 (H) 08/24/2020    CHOLHDL 4 08/24/2020  Maintained on atorvastatin. Will repeat Lipid panel.         Relevant Orders   Lipid panel   HTN (hypertension)    BP Readings from Last 3 Encounters:  03/14/21 100/80  08/29/20 110/68  08/24/20 124/78  Overtreated.  Stop HCTZ, stop Kdur.        Relevant Orders   Comp Met (CMET)     No orders of the defined types were placed in this encounter.   I, Debbrah Alar NP, personally preformed the services described in this documentation.  All medical record entries made by the scribe were at my direction and in my presence.  I have reviewed the chart and discharge instructions (if applicable) and agree that the record reflects my personal performance and is accurate and complete. 03/14/2021   I,Shehryar Baig,acting as a Education administrator for Nance Pear, NP.,have documented all relevant documentation on the behalf of Nance Pear, NP,as directed by  Nance Pear, NP while in the presence of Nance Pear, NP.   Nance Pear, NP

## 2021-03-14 NOTE — Assessment & Plan Note (Signed)
BP Readings from Last 3 Encounters:  03/14/21 100/80  08/29/20 110/68  08/24/20 124/78   Overtreated.  Stop HCTZ, stop Kdur.

## 2021-03-14 NOTE — Assessment & Plan Note (Signed)
Lab Results  Component Value Date   CHOL 190 08/24/2020   HDL 47.10 08/24/2020   LDLCALC 112 (H) 08/24/2020   TRIG 156.0 (H) 08/24/2020   CHOLHDL 4 08/24/2020   Maintained on atorvastatin. Will repeat Lipid panel.

## 2021-03-14 NOTE — Telephone Encounter (Signed)
Request sent over for Pap.

## 2021-03-14 NOTE — Telephone Encounter (Signed)
Please call [facility] and request a copy of her last pap report.

## 2021-03-15 ENCOUNTER — Encounter: Payer: Self-pay | Admitting: *Deleted

## 2021-03-20 ENCOUNTER — Other Ambulatory Visit (HOSPITAL_COMMUNITY): Payer: Self-pay

## 2021-04-07 ENCOUNTER — Other Ambulatory Visit (INDEPENDENT_AMBULATORY_CARE_PROVIDER_SITE_OTHER): Payer: 59

## 2021-04-07 ENCOUNTER — Other Ambulatory Visit: Payer: Self-pay

## 2021-04-07 ENCOUNTER — Ambulatory Visit (INDEPENDENT_AMBULATORY_CARE_PROVIDER_SITE_OTHER): Payer: 59 | Admitting: Family

## 2021-04-07 VITALS — BP 130/86 | HR 99

## 2021-04-07 DIAGNOSIS — I10 Essential (primary) hypertension: Secondary | ICD-10-CM

## 2021-04-07 LAB — BASIC METABOLIC PANEL
BUN: 11 mg/dL (ref 6–23)
CO2: 22 mEq/L (ref 19–32)
Calcium: 8.5 mg/dL (ref 8.4–10.5)
Chloride: 107 mEq/L (ref 96–112)
Creatinine, Ser: 0.61 mg/dL (ref 0.40–1.20)
GFR: 111.87 mL/min (ref >60.00)
Glucose, Bld: 88 mg/dL (ref 70–99)
Potassium: 3.9 mEq/L (ref 3.5–5.1)
Sodium: 138 mEq/L (ref 135–145)

## 2021-04-07 NOTE — Addendum Note (Signed)
Addended by: Rosita Kea on: 04/07/2021 09:20 AM   Modules accepted: Orders

## 2021-04-07 NOTE — Progress Notes (Signed)
Pt here for Blood pressure check per PCP   Pt currently takes: losartan 50mg  and amlodipine 5mg    Pt reports compliance with medication.  BP Readings from Last 3 Encounters:  03/14/21 100/80  08/29/20 110/68  08/24/20 124/78   Right Arm BP today @ = 130/88 HR = 97  Left Arm BP today @ = 130/86 HR = 99  Pt advised per PCP to continue same regimen and follow up in 3 months.  CPE scheduled for 08/25/21, since she was due.

## 2021-04-30 ENCOUNTER — Telehealth: Payer: 59 | Admitting: Family

## 2021-04-30 DIAGNOSIS — J019 Acute sinusitis, unspecified: Secondary | ICD-10-CM | POA: Diagnosis not present

## 2021-04-30 MED ORDER — AMOXICILLIN-POT CLAVULANATE 875-125 MG PO TABS
1.0000 | ORAL_TABLET | Freq: Two times a day (BID) | ORAL | 0 refills | Status: DC
  Filled 2021-04-30: qty 14, 7d supply, fill #0

## 2021-04-30 NOTE — Progress Notes (Signed)

## 2021-05-01 ENCOUNTER — Other Ambulatory Visit (HOSPITAL_COMMUNITY): Payer: Self-pay

## 2021-05-09 ENCOUNTER — Other Ambulatory Visit (HOSPITAL_COMMUNITY): Payer: Self-pay

## 2021-06-13 ENCOUNTER — Other Ambulatory Visit (HOSPITAL_COMMUNITY): Payer: Self-pay

## 2021-06-15 ENCOUNTER — Ambulatory Visit: Payer: 59 | Admitting: Family

## 2021-06-15 ENCOUNTER — Other Ambulatory Visit: Payer: Self-pay

## 2021-06-15 ENCOUNTER — Ambulatory Visit (INDEPENDENT_AMBULATORY_CARE_PROVIDER_SITE_OTHER)
Admission: RE | Admit: 2021-06-15 | Discharge: 2021-06-15 | Disposition: A | Discharge status: Home / Self Care | Payer: 59 | Source: Ambulatory Visit | Attending: Family | Admitting: Family

## 2021-06-15 ENCOUNTER — Encounter: Payer: Self-pay | Admitting: Family

## 2021-06-15 DIAGNOSIS — M79672 Pain in left foot: Secondary | ICD-10-CM

## 2021-06-15 DIAGNOSIS — R600 Localized edema: Secondary | ICD-10-CM | POA: Diagnosis not present

## 2021-06-15 DIAGNOSIS — M79675 Pain in left toe(s): Secondary | ICD-10-CM | POA: Insufficient documentation

## 2021-06-15 DIAGNOSIS — M7989 Other specified soft tissue disorders: Secondary | ICD-10-CM | POA: Diagnosis not present

## 2021-06-15 NOTE — Progress Notes (Signed)
Acute Office Visit  Subjective:    Patient ID: Alyssa Berry, female    DOB: 1980-10-16, 40 y.o.   MRN: [id number]  Chief Complaint  Patient presents with   Nail Problem    Pt dropped a tv tray Tuesday on her left foot. A lot of discomfort when walking for long periods of time.     HPI Patient is in today for left foot & toe pain. Pt states she dropped a TV table on her foot 2 nights ago, Left great toe became swollen and bruised, c/o numbness, continued pain, no tingling, pt reports applying ice after initial incident. Pt is able to bear weight on foot without assistance.  Past Medical History:  Diagnosis Date   Arthritis    Hypertension    Migraine    Primary biliary cirrhosis (HCC)    PVC (premature ventricular contraction)     Past Surgical History:  Procedure Laterality Date   TUBAL LIGATION  2008    Family History  Problem Relation Age of Onset   Hyperlipidemia Mother    Hypertension Mother    Cancer Maternal Grandmother        lung   Hyperlipidemia Maternal Grandmother    Hypertension Maternal Grandmother    Hyperlipidemia Maternal Grandfather    Depression Daughter    Hypertension Sister    Hypertension Brother    Colon cancer Neg Hx       Outpatient Medications Prior to Visit  Medication Sig Dispense Refill   amLODipine (NORVASC) 5 MG tablet TAKE 1 TABLET BY MOUTH DAILY. 90 tablet 1   atorvastatin (LIPITOR) 40 MG tablet Take 1 tablet (40 mg total) by mouth daily. 90 tablet 1   cyclobenzaprine (FLEXERIL) 10 MG tablet Take 1 tablet (10 mg total) by mouth 3 (three) times daily as needed for muscle spasms. 30 tablet 0   fluticasone (FLONASE) 50 MCG/ACT nasal spray Place 2 sprays into both nostrils daily. 16 g 6   losartan (COZAAR) 50 MG tablet TAKE 1 TABLET BY MOUTH ONCE A DAY 90 tablet 1   Multiple Vitamins-Minerals (MULTIVITAL) tablet Take 1 tablet by mouth daily. WITH POTASSIUM     amoxicillin-clavulanate (AUGMENTIN) 875-125 MG tablet Take 1 tablet  by mouth 2 (two) times daily. 14 tablet 0   levonorgestrel-ethinyl estradiol (ALESSE) 0.1-20 MG-MCG tablet Take 1 tablet by mouth every day 84 tablet 3   LO LOESTRIN FE 1 MG-10 MCG / 10 MCG tablet TAKE 1 TABLET BY MOUTH ONCE DAILY (Patient not taking: Reported on 06/15/2021) 84 tablet 1   No facility-administered medications prior to visit.    No Known Allergies     Objective:    Physical Exam Vitals and nursing note reviewed.  Constitutional:      Appearance: Normal appearance.  Cardiovascular:     Rate and Rhythm: Normal rate and regular rhythm.     Pulses:          Dorsalis pedis pulses are 1+ on the left side.  Pulmonary:     Effort: Pulmonary effort is normal.     Breath sounds: Normal breath sounds.  Musculoskeletal:     Left foot: Decreased range of motion.       Feet:  Feet:     Left foot:     Skin integrity: Skin integrity normal. No erythema or warmth.     Toenail Condition: Left toenails are normal.     Comments: Mild edema & ecchymosis noted on great toe & proximally per arrow direction  noted above. Skin:    General: Skin is warm and dry.  Neurological:     Mental Status: She is alert.  Psychiatric:        Mood and Affect: Mood normal.        Behavior: Behavior normal.    BP 128/86   Pulse (!) 115   Temp 98.7 F (37.1 C) (Temporal)   Ht 5\' 1"  (1.549 m)   Wt 167 lb 3.2 oz (75.8 kg)   SpO2 99%   BMI 31.59 kg/m  Wt Readings from Last 3 Encounters:  06/15/21 167 lb 3.2 oz (75.8 kg)  03/14/21 159 lb 9.6 oz (72.4 kg)  08/29/20 154 lb (69.9 kg)    Health Maintenance Due  Topic Date Due   HIV Screening  Never done   COVID-19 Vaccine (3 - Booster for Pfizer series) 05/12/2020    There are no preventive care reminders to display for this patient.   Lab Results  Component Value Date   TSH 1.11 06/05/2019   Lab Results  Component Value Date   WBC 9.3 12/21/2019   HGB 13.3 12/21/2019   HCT 39.7 12/21/2019   MCV 88.9 12/21/2019   PLT 317.0  12/21/2019   Lab Results  Component Value Date   NA 138 04/07/2021   K 3.9 04/07/2021   CO2 22 04/07/2021   GLUCOSE 88 04/07/2021   BUN 11 04/07/2021   CREATININE 0.61 04/07/2021   BILITOT 0.6 03/14/2021   ALKPHOS 68 03/14/2021   AST 16 03/14/2021   ALT 19 03/14/2021   PROT 7.2 03/14/2021   ALBUMIN 4.2 03/14/2021   CALCIUM 8.5 04/07/2021   GFR 111.87 04/07/2021   Lab Results  Component Value Date   CHOL 150 03/14/2021   Lab Results  Component Value Date   HDL 41.30 03/14/2021   Lab Results  Component Value Date   LDLCALC 75 03/14/2021   Lab Results  Component Value Date   TRIG 167.0 (H) 03/14/2021   Lab Results  Component Value Date   CHOLHDL 4 03/14/2021   No results found for: HGBA1C     Assessment & Plan:   Problem List Items Addressed This Visit       Other   Acute foot pain, left    Injury occurred 2 nights ago, continuous pain, swelling & bruising. Xray ordered. Advised to continue to ice tid along with tylenol or Ibuprofen prn.      Relevant Orders   DG Foot Complete Left     No orders of the defined types were placed in this encounter.    05/15/2021, NP

## 2021-06-15 NOTE — Patient Instructions (Signed)
I have ordered an xray for your left foot. We will call you with the results. Continue to apply ice for 20-87minutes 3 times/day and take tylenol or Ibuprofen every 6-8hours as needed.

## 2021-06-15 NOTE — Assessment & Plan Note (Signed)
Injury occurred 2 nights ago, continuous pain, swelling & bruising. Xray ordered. Advised to continue to ice tid along with tylenol or Ibuprofen prn.

## 2021-06-16 ENCOUNTER — Ambulatory Visit: Payer: 59 | Admitting: Family

## 2021-07-12 ENCOUNTER — Ambulatory Visit: Payer: 59 | Admitting: Gastroenterology

## 2021-07-14 ENCOUNTER — Ambulatory Visit: Payer: 59 | Admitting: Family

## 2021-07-14 ENCOUNTER — Other Ambulatory Visit (HOSPITAL_COMMUNITY): Payer: Self-pay

## 2021-07-14 ENCOUNTER — Other Ambulatory Visit: Payer: Self-pay

## 2021-07-14 VITALS — BP 130/85 | HR 96 | Temp 98.6°F | Resp 16 | Wt 168.0 lb

## 2021-07-14 DIAGNOSIS — Z30013 Encounter for initial prescription of injectable contraceptive: Secondary | ICD-10-CM | POA: Diagnosis not present

## 2021-07-14 DIAGNOSIS — Z309 Encounter for contraceptive management, unspecified: Secondary | ICD-10-CM | POA: Insufficient documentation

## 2021-07-14 DIAGNOSIS — E785 Hyperlipidemia, unspecified: Secondary | ICD-10-CM | POA: Diagnosis not present

## 2021-07-14 DIAGNOSIS — I10 Essential (primary) hypertension: Secondary | ICD-10-CM

## 2021-07-14 DIAGNOSIS — G43809 Other migraine, not intractable, without status migrainosus: Secondary | ICD-10-CM | POA: Diagnosis not present

## 2021-07-14 DIAGNOSIS — Z30019 Encounter for initial prescription of contraceptives, unspecified: Secondary | ICD-10-CM | POA: Diagnosis not present

## 2021-07-14 LAB — POCT URINE PREGNANCY: Preg Test, Ur: NEGATIVE

## 2021-07-14 MED ORDER — MEDROXYPROGESTERONE ACETATE 150 MG/ML IM SUSP
150.0000 mg | Freq: Once | INTRAMUSCULAR | Status: AC
  Administered 2021-07-14: 150 mg via INTRAMUSCULAR

## 2021-07-14 MED ORDER — PROPRANOLOL HCL 20 MG PO TABS
20.0000 mg | ORAL_TABLET | Freq: Two times a day (BID) | ORAL | 1 refills | Status: DC
  Filled 2021-07-14: qty 60, 30d supply, fill #0
  Filled 2021-08-13: qty 60, 30d supply, fill #1

## 2021-07-14 NOTE — Progress Notes (Signed)
Subjective:   By signing my name below, I, Alyssa Berry, attest that this documentation has been prepared under the direction and in the presence of Alyssa Craze, Alyssa Berry, 07/14/2021   Patient ID: Alyssa Berry, female    DOB: 06/05/1981, 40 y.o.   MRN: [id number]  Chief Complaint  Patient presents with   Migraine    For follow up ,   Contraception    Here for birth control management    HPI Patient is in today for an office visit.  Birth control: She is interested in receiving the Depo birth control injection. She was working with her gynecologist to find a birth control that was right for her. She began trying different forms of birth control pills. She either had a really painful and heavy period or terrible headaches so she is wanting to do the Depo shot. She notes it is more convenient for her to come to the office here than to go to her gynecologist for these visits. She has used Depo previously in the past and denies any complications during her prior use.  Migraines: She is complaining of reoccurring migraines. She was previously on amitriptyline  for this but it made her too sleepy the following day when taken at bed time so she stopped taking it. She is noticing that with increased stress in her life her migraines are worsening.  Blood pressure: Her blood pressure is within normal range today during this visit. She is compliant in taking 50 mg Losartan and 5 mg Amlodipine to manage her high blood pressure.  BP Readings from Last 3 Encounters:  07/14/21 130/85  06/15/21 128/86  04/07/21 130/86   Health Maintenance Due  Topic Date Due   Pneumococcal Vaccine 14-2 Years old (1 - PCV) Never done   HIV Screening  Never done   COVID-19 Vaccine (3 - Booster for [employer] series) 02/05/2020    Past Medical History:  Diagnosis Date   Arthritis    Hypertension    Migraine    Primary biliary cirrhosis (HCC)    PVC (premature ventricular contraction)     Past  Surgical History:  Procedure Laterality Date   TUBAL LIGATION  2008    Family History  Problem Relation Age of Onset   Hyperlipidemia Mother    Hypertension Mother    Cancer Maternal Grandmother        lung   Hyperlipidemia Maternal Grandmother    Hypertension Maternal Grandmother    Hyperlipidemia Maternal Grandfather    Depression Daughter    Hypertension Sister    Hypertension Brother    Colon cancer Neg Hx     Social History   Socioeconomic History   Marital status: Married    Spouse name: Not on file   Number of children: 3   Years of education: Not on file   Highest education level: Not on file  Occupational History   Occupation: CMA  Tobacco Use   Smoking status: Never   Smokeless tobacco: Never  Vaping Use   Vaping Use: Never used  Substance and Sexual Activity   Alcohol use: No    Alcohol/week: 0.0 standard drinks   Drug use: No   Sexual activity: Not on file    Comment: tubal ligation  Other Topics Concern   Not on file  Social History Narrative   Married   3 Children   2002- daughter Otho Ket   2004- Cathryn   2008Alphonzo Lemmings   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 90 tablet 1   atorvastatin (LIPITOR) 40 MG tablet Take 1 tablet (40 mg total) by mouth daily. 90 tablet 1   cyclobenzaprine (FLEXERIL) 10 MG tablet Take 1 tablet (10 mg total) by mouth 3 (three) times daily as needed for muscle spasms. 30 tablet 0   fluticasone (FLONASE) 50 MCG/ACT nasal spray Place 2 sprays into both nostrils  daily. 16 g 6   losartan (COZAAR) 50 MG tablet TAKE 1 TABLET BY MOUTH ONCE A DAY 90 tablet 1   Multiple Vitamins-Minerals (MULTIVITAL) tablet Take 1 tablet by mouth daily. WITH POTASSIUM     LO LOESTRIN FE 1 MG-10 MCG / 10 MCG tablet TAKE 1 TABLET BY MOUTH ONCE DAILY (Patient not taking: Reported on 06/15/2021) 84 tablet 1   No facility-administered medications prior to visit.    No Known Allergies  Review of Systems  Neurological:  Positive for headaches (migraines).      Objective:    Physical Exam Constitutional:      General: She is not in acute distress.    Appearance: Normal appearance. She is not ill-appearing.  HENT:     Head: Normocephalic and atraumatic.     Right Ear: External ear normal.     Left Ear: External ear normal.  Eyes:     Extraocular Movements: Extraocular movements intact.     Pupils: Pupils are equal, round, and reactive to light.  Cardiovascular:     Rate and Rhythm: Normal rate and regular rhythm.     Heart sounds: Normal heart sounds. No murmur heard.   No gallop.  Pulmonary:     Effort: Pulmonary effort is normal. No respiratory distress.     Breath sounds: Normal breath sounds. No wheezing or rales.  Lymphadenopathy:     Cervical: No cervical adenopathy.  Skin:    General: Skin is warm and dry.  Neurological:     Mental Status: She is alert and oriented to person, place, and time.  Psychiatric:        Behavior: Behavior normal.        Judgment: Judgment normal.    BP 130/85 (BP Location: Right Arm, Patient Position: Sitting, Cuff Size: Small)   Pulse 96   Temp 98.6 F (37 C) (Oral)   Resp 16   Wt 168 lb (76.2 kg)   SpO2 99%   BMI 31.74 kg/m  Wt Readings from Last 3 Encounters:  07/14/21 168 lb (76.2 kg)  06/15/21 167 lb 3.2 oz (75.8 kg)  03/14/21 159 lb 9.6 oz (72.4 kg)       Assessment & Plan:   Problem List Items Addressed This Visit       Unprioritized   Primary hypertension    Fair control. Continue amlodipine 5mg   and losartan 50mg .      Relevant Medications   propranolol (INDERAL) 20 MG tablet   Migraines    Uncontrolled. Will give trial of propranolol 20mg  bid for migraine prophylaxis.       Relevant  Medications   propranolol (INDERAL) 20 MG tablet   Hyperlipidemia    Atorvastatin 40mg  once daily. LDL stable. Continue same.       Relevant Medications   propranolol (INDERAL) 20 MG tablet   Encounter for initial prescription of contraceptives - Primary    Urine HCG negative. Initiate Depo Provera today.       Relevant Orders   POCT urine pregnancy (Completed)   Meds ordered this encounter  Medications   propranolol (INDERAL) 20 MG tablet    Sig: Take 1 tablet (20 mg total) by mouth 2 (two) times daily.    Dispense:  60 tablet    Refill:  1    Order Specific Question:   Supervising Provider    Answer:   A [[id number]]   medroxyPROGESTERone (DEPO-PROVERA) injection 150 mg    I, Danise Edge, Alyssa Berry, personally preformed the services described in this documentation.  All medical record entries made by the scribe were at my direction and in my presence.  I have reviewed the chart and discharge instructions (if applicable) and agree that the record reflects my personal performance and is accurate and complete. 07/14/2021  I,Alyssa Berry,acting as a 13/07/2021 for Neurosurgeon, Alyssa Berry.,have documented all relevant documentation on the behalf of Alyssa Fillers, Alyssa Berry,as directed by  Alyssa Fillers, Alyssa Berry while in the presence of Alyssa Fillers, Alyssa Berry.  Alyssa Fillers, Alyssa Berry

## 2021-07-14 NOTE — Assessment & Plan Note (Signed)
Urine HCG negative. Initiate Depo Provera today.

## 2021-07-14 NOTE — Assessment & Plan Note (Signed)
Uncontrolled. Will give trial of propranolol 20mg  bid for migraine prophylaxis.

## 2021-07-14 NOTE — Patient Instructions (Signed)
Please begin propranolol twice daily for migraine prevention.

## 2021-07-14 NOTE — Assessment & Plan Note (Signed)
Atorvastatin 40mg  once daily. LDL stable. Continue same.

## 2021-07-14 NOTE — Assessment & Plan Note (Signed)
Fair control. Continue amlodipine 5mg  and losartan 50mg .

## 2021-08-13 ENCOUNTER — Other Ambulatory Visit: Payer: Self-pay | Admitting: Family

## 2021-08-13 ENCOUNTER — Other Ambulatory Visit (HOSPITAL_COMMUNITY): Payer: Self-pay

## 2021-08-14 ENCOUNTER — Other Ambulatory Visit (HOSPITAL_COMMUNITY): Payer: Self-pay

## 2021-08-14 MED ORDER — LOSARTAN POTASSIUM 50 MG PO TABS
ORAL_TABLET | Freq: Every day | ORAL | 1 refills | Status: DC
  Filled 2021-08-14: qty 90, 90d supply, fill #0
  Filled 2021-11-19: qty 90, 90d supply, fill #1

## 2021-08-14 MED ORDER — AMLODIPINE BESYLATE 5 MG PO TABS
ORAL_TABLET | Freq: Every day | ORAL | 1 refills | Status: DC
  Filled 2021-08-14: qty 90, 90d supply, fill #0

## 2021-08-14 MED FILL — Atorvastatin Calcium Tab 40 MG (Base Equivalent): ORAL | 90 days supply | Qty: 90 | Fill #0 | Status: AC

## 2021-08-22 ENCOUNTER — Ambulatory Visit: Payer: 59 | Admitting: Gastroenterology

## 2021-08-25 ENCOUNTER — Encounter: Payer: Self-pay | Admitting: Family

## 2021-08-25 ENCOUNTER — Encounter: Payer: 59 | Admitting: Family

## 2021-08-25 ENCOUNTER — Ambulatory Visit (INDEPENDENT_AMBULATORY_CARE_PROVIDER_SITE_OTHER): Payer: 59 | Admitting: Family

## 2021-08-25 VITALS — BP 142/95 | HR 81 | Temp 98.1°F | Resp 16 | Ht 61.0 in | Wt 172.2 lb

## 2021-08-25 DIAGNOSIS — Z0000 Encounter for general adult medical examination without abnormal findings: Secondary | ICD-10-CM

## 2021-08-25 DIAGNOSIS — I10 Essential (primary) hypertension: Secondary | ICD-10-CM

## 2021-08-25 DIAGNOSIS — E781 Pure hyperglyceridemia: Secondary | ICD-10-CM | POA: Diagnosis not present

## 2021-08-25 LAB — COMPREHENSIVE METABOLIC PANEL
ALT: 40 U/L — ABNORMAL HIGH (ref 0–35)
AST: 26 U/L (ref 0–37)
Albumin: 4.2 g/dL (ref 3.5–5.2)
Alkaline Phosphatase: 87 U/L (ref 39–117)
BUN: 10 mg/dL (ref 6–23)
CO2: 26 mEq/L (ref 19–32)
Calcium: 8.9 mg/dL (ref 8.4–10.5)
Chloride: 105 mEq/L (ref 96–112)
Creatinine, Ser: 0.55 mg/dL (ref 0.40–1.20)
GFR: 114.39 mL/min (ref >60.00)
Glucose, Bld: 91 mg/dL (ref 70–99)
Potassium: 3.7 mEq/L (ref 3.5–5.1)
Sodium: 140 mEq/L (ref 135–145)
Total Bilirubin: 0.3 mg/dL (ref 0.2–1.2)
Total Protein: 7.8 g/dL (ref 6.0–8.3)

## 2021-08-25 LAB — LIPID PANEL
Cholesterol: 152 mg/dL (ref 0–200)
HDL: 44.6 mg/dL (ref >39.00)
LDL Cholesterol: 89 mg/dL (ref 0–99)
NonHDL: 107.29
Total CHOL/HDL Ratio: 3
Triglycerides: 92 mg/dL (ref 0.0–149.0)
VLDL: 18.4 mg/dL (ref 0.0–40.0)

## 2021-08-25 NOTE — Assessment & Plan Note (Signed)
Discussed healthy diet, exercise, weight loss. Recommended that she obtain covid bivalent booster. Mammogram is scheduled.

## 2021-08-25 NOTE — Assessment & Plan Note (Signed)
BP is elevated today. Recommended that she continue current medication. Check bp several times next week. Contact me with her home bp readings for further recommendations.

## 2021-08-25 NOTE — Progress Notes (Signed)
Subjective:     Patient ID: Alyssa Berry, female    DOB: 02/20/1981, 40 y.o.   MRN: [id number]  Chief Complaint  Patient presents with   Annual Exam    Here for Annual Exam     HPI Patient is in today for cpx.  Immunizations: tetanus due in 1 year, had one booster pfizer Diet: Some stress eating Wt Readings from Last 3 Encounters:  08/25/21 172 lb 3.2 oz (78.1 kg)  07/14/21 168 lb (76.2 kg)  06/15/21 167 lb 3.2 oz (75.8 kg)  Vision: utd Dental: UTD  Exercise: walks his dog 3 x a week. 30-40 minutes Pap Smear: 7/22- GYN Mammogram: Scheduled for February.    Health Maintenance Due  Topic Date Due   Pneumococcal Vaccine 74-31 Years old (1 - PCV) Never done   HIV Screening  Never done   COVID-19 Vaccine (4 - Booster for [employer] series) 11/11/2020    Past Medical History:  Diagnosis Date   Arthritis    Hypertension    Migraine    Primary biliary cirrhosis (HCC)    PVC (premature ventricular contraction)     Past Surgical History:  Procedure Laterality Date   TUBAL LIGATION  2008    Family History  Problem Relation Age of Onset   Hyperlipidemia Mother    Hypertension Mother    Cancer Maternal Grandmother        lung   Hyperlipidemia Maternal Grandmother    Hypertension Maternal Grandmother    Hyperlipidemia Maternal Grandfather    Depression Daughter    Hypertension Sister    Hypertension Brother    Colon cancer Neg Hx     Social History   Socioeconomic History   Marital status: Married    Spouse name: Not on file   Number of children: 3   Years of education: Not on file   Highest education level: Not on file  Occupational History   Occupation: CMA  Tobacco Use   Smoking status: Never   Smokeless tobacco: Never  Vaping Use   Vaping Use: Never used  Substance and Sexual Activity   Alcohol use: No    Alcohol/week: 0.0 standard drinks   Drug use: No   Sexual activity: Not on file    Comment: tubal ligation  Other Topics Concern   Not  on file  Social History Narrative   Married   3 Children   2002- daughter Theadore Nan   2004- Cathryn   2008Lorayne Marek   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 90 tablet 1   atorvastatin (LIPITOR) 40 MG tablet Take 1 tablet (40 mg total) by mouth daily. 90 tablet 1   cyclobenzaprine (FLEXERIL) 10 MG tablet Take 1 tablet (10 mg total) by mouth 3 (three) times daily as needed for muscle spasms. 30 tablet 0   fluticasone (FLONASE) 50 MCG/ACT nasal spray Place 2 sprays into both nostrils daily. 16 g 6   losartan (COZAAR) 50 MG tablet TAKE 1 TABLET BY  MOUTH ONCE A DAY 90 tablet 1   Multiple Vitamins-Minerals (MULTIVITAL) tablet Take 1 tablet by mouth daily. WITH POTASSIUM     propranolol (INDERAL) 20 MG tablet Take 1 tablet (20 mg total) by mouth 2 (two) times daily. 60 tablet 1   No facility-administered medications prior to visit.    No Known Allergies  BP Readings from Last 3 Encounters:  08/25/21 (!) 142/95  07/14/21 130/85  06/15/21 128/86    Review of Systems  Constitutional:  Negative for fever.  HENT:  Negative for congestion.   Eyes:  Negative for blurred vision.  Respiratory: Negative.  Negative for shortness of breath.   Cardiovascular:  Negative for chest pain.  Gastrointestinal:  Negative for diarrhea, nausea and vomiting.  Genitourinary:  Negative for dysuria, frequency and hematuria.  Musculoskeletal:  Negative for myalgias.  Skin:  Negative for rash.  Neurological:  Positive for headaches (migraines about  once a week, less since she started propranolo).  Psychiatric/Behavioral:         Denies depression/anxiety      Objective:    Physical Exam  BP (!) 142/95    Pulse 81    Temp 98.1 F (36.7 C) (Oral)    Resp 16    Ht $R'5\' 1"'nl$  (1.549 m)    Wt 172 lb 3.2 oz (78.1 kg)    SpO2 100%    BMI 32.54 kg/m  Wt Readings from Last 3 Encounters:  08/25/21 172 lb 3.2 oz (78.1 kg)  07/14/21 168 lb (76.2 kg)  06/15/21 167 lb 3.2 oz (75.8 kg)   Physical Exam  Constitutional: She is oriented to person, place, and time. She appears well-developed and well-nourished. No distress.  HENT:  Head: Normocephalic and atraumatic.  Right Ear: Tympanic membrane and ear canal normal.  Left Ear: Tympanic membrane and ear canal normal.  Mouth/Throat: Not examined- pt wearing mask Eyes: Pupils are equal, round, and reactive to light. No scleral icterus.  Neck: Normal range of motion. No thyromegaly present.  Cardiovascular: Normal rate and regular rhythm.   No murmur heard. Pulmonary/Chest: Effort normal and breath sounds normal. No respiratory distress. He has no wheezes. She has no rales. She exhibits no tenderness.  Abdominal: Soft. Bowel sounds are normal. She exhibits no distension and no mass. There is no tenderness. There is no rebound and no guarding.  Musculoskeletal: She exhibits no edema.  Lymphadenopathy:    She has no cervical adenopathy.  Neurological: She is alert and oriented to person, place, and time. She has normal patellar reflexes. She exhibits normal muscle tone. Coordination normal.  Skin: Skin is warm and dry.  Psychiatric: She has a normal mood and affect. Her behavior is normal. Judgment and thought content normal.  Breasts: Examined lying Right: Without masses, retractions, discharge or axillary adenopathy.  Left: Without masses, retractions, discharge or axillary adenopathy.  Pelvic: deferred           Assessment & Plan:       Assessment & Plan:   Problem List Items  Addressed This Visit       Unprioritized   Primary hypertension    BP is elevated today. Recommended that she continue current medication. Check bp several times next week. Contact me with her home bp readings for further recommendations.       Relevant Orders   Comp Met (CMET)   Preventative health care - Primary    Discussed healthy diet, exercise, weight loss. Recommended that she obtain covid bivalent booster. Mammogram  is scheduled.       Other Visit Diagnoses     Hypertriglyceridemia       Relevant Orders   Lipid panel       I am having Alyssa Berry "Alyssa Berry" maintain her Multivital, cyclobenzaprine, fluticasone, propranolol, losartan, amLODipine, and atorvastatin.  No orders of the defined types were placed in this encounter.

## 2021-08-25 NOTE — Patient Instructions (Signed)
Please complete lab work prior to leaving.   

## 2021-09-04 DIAGNOSIS — H5213 Myopia, bilateral: Secondary | ICD-10-CM | POA: Diagnosis not present

## 2021-09-09 ENCOUNTER — Encounter: Payer: Self-pay | Admitting: Family

## 2021-09-10 ENCOUNTER — Other Ambulatory Visit: Payer: Self-pay | Admitting: Family

## 2021-09-11 ENCOUNTER — Other Ambulatory Visit (HOSPITAL_COMMUNITY): Payer: Self-pay

## 2021-09-11 MED ORDER — PROPRANOLOL HCL 20 MG PO TABS
20.0000 mg | ORAL_TABLET | Freq: Two times a day (BID) | ORAL | 1 refills | Status: DC
  Filled 2021-09-11: qty 60, 30d supply, fill #0

## 2021-10-13 ENCOUNTER — Ambulatory Visit (INDEPENDENT_AMBULATORY_CARE_PROVIDER_SITE_OTHER): Payer: 59 | Admitting: *Deleted

## 2021-10-13 DIAGNOSIS — Z30019 Encounter for initial prescription of contraceptives, unspecified: Secondary | ICD-10-CM | POA: Diagnosis not present

## 2021-10-13 MED ORDER — MEDROXYPROGESTERONE ACETATE 150 MG/ML IM SUSP
150.0000 mg | Freq: Once | INTRAMUSCULAR | Status: AC
  Administered 2021-10-13: 150 mg via INTRAMUSCULAR

## 2021-10-13 NOTE — Progress Notes (Signed)
Patient is here for depo provera injection   Injection given in right upper quadrant and patient tolerated well     Next injection due 12/29/21 to 01/12/22

## 2021-10-15 ENCOUNTER — Encounter: Payer: Self-pay | Admitting: Family

## 2021-10-17 ENCOUNTER — Other Ambulatory Visit (HOSPITAL_COMMUNITY): Payer: Self-pay

## 2021-10-17 MED ORDER — PROPRANOLOL HCL 40 MG PO TABS
40.0000 mg | ORAL_TABLET | Freq: Two times a day (BID) | ORAL | 1 refills | Status: DC
  Filled 2021-10-17: qty 60, 30d supply, fill #0
  Filled 2021-11-19: qty 60, 30d supply, fill #1

## 2021-10-17 MED ORDER — AMLODIPINE BESYLATE 10 MG PO TABS
10.0000 mg | ORAL_TABLET | Freq: Every day | ORAL | 1 refills | Status: DC
  Filled 2021-10-17: qty 90, 90d supply, fill #0
  Filled 2022-01-01: qty 90, 90d supply, fill #1

## 2021-10-17 NOTE — Addendum Note (Signed)
Addended by: Debbrah Alar on: 10/17/2021 02:44 PM   Modules accepted: Orders

## 2021-10-19 DIAGNOSIS — Z3009 Encounter for other general counseling and advice on contraception: Secondary | ICD-10-CM | POA: Diagnosis not present

## 2021-10-19 DIAGNOSIS — Z6833 Body mass index (BMI) 33.0-33.9, adult: Secondary | ICD-10-CM | POA: Diagnosis not present

## 2021-10-19 DIAGNOSIS — Z01419 Encounter for gynecological examination (general) (routine) without abnormal findings: Secondary | ICD-10-CM | POA: Diagnosis not present

## 2021-10-19 DIAGNOSIS — Z1389 Encounter for screening for other disorder: Secondary | ICD-10-CM | POA: Diagnosis not present

## 2021-10-19 DIAGNOSIS — Z130 Encounter for screening for diseases of the blood and blood-forming organs and certain disorders involving the immune mechanism: Secondary | ICD-10-CM | POA: Diagnosis not present

## 2021-10-19 DIAGNOSIS — Z124 Encounter for screening for malignant neoplasm of cervix: Secondary | ICD-10-CM | POA: Diagnosis not present

## 2021-10-19 DIAGNOSIS — Z1231 Encounter for screening mammogram for malignant neoplasm of breast: Secondary | ICD-10-CM | POA: Diagnosis not present

## 2021-10-19 DIAGNOSIS — Z1151 Encounter for screening for human papillomavirus (HPV): Secondary | ICD-10-CM | POA: Diagnosis not present

## 2021-10-19 LAB — HM PAP SMEAR: HPV, high-risk: NEGATIVE

## 2021-10-20 ENCOUNTER — Encounter: Payer: Self-pay | Admitting: Gastroenterology

## 2021-10-25 ENCOUNTER — Ambulatory Visit: Payer: 59 | Admitting: Gastroenterology

## 2021-10-25 ENCOUNTER — Encounter: Payer: Self-pay | Admitting: Gastroenterology

## 2021-10-25 ENCOUNTER — Other Ambulatory Visit (INDEPENDENT_AMBULATORY_CARE_PROVIDER_SITE_OTHER): Payer: 59

## 2021-10-25 VITALS — BP 136/88 | HR 105 | Ht 61.0 in | Wt 169.1 lb

## 2021-10-25 DIAGNOSIS — K743 Primary biliary cirrhosis: Secondary | ICD-10-CM

## 2021-10-25 LAB — COMPREHENSIVE METABOLIC PANEL
ALT: 26 U/L (ref 0–35)
AST: 19 U/L (ref 0–37)
Albumin: 4.5 g/dL (ref 3.5–5.2)
Alkaline Phosphatase: 91 U/L (ref 39–117)
BUN: 12 mg/dL (ref 6–23)
CO2: 24 mEq/L (ref 19–32)
Calcium: 9.1 mg/dL (ref 8.4–10.5)
Chloride: 102 mEq/L (ref 96–112)
Creatinine, Ser: 0.72 mg/dL (ref 0.40–1.20)
GFR: 104.22 mL/min (ref >60.00)
Glucose, Bld: 181 mg/dL — ABNORMAL HIGH (ref 70–99)
Potassium: 3.4 mEq/L — ABNORMAL LOW (ref 3.5–5.1)
Sodium: 135 mEq/L (ref 135–145)
Total Bilirubin: 0.4 mg/dL (ref 0.2–1.2)
Total Protein: 8.2 g/dL (ref 6.0–8.3)

## 2021-10-25 LAB — CBC
HCT: 39.7 % (ref 36.0–46.0)
Hemoglobin: 13.5 g/dL (ref 12.0–15.0)
MCHC: 34 g/dL (ref 30.0–36.0)
MCV: 87.9 fl (ref 78.0–100.0)
Platelets: 371 10*3/uL (ref 150.0–400.0)
RBC: 4.52 Mil/uL (ref 3.87–5.11)
RDW: 12.8 % (ref 11.5–15.5)
WBC: 8.5 10*3/uL (ref 4.0–10.5)

## 2021-10-25 LAB — PROTIME-INR
INR: 1.1 ratio — ABNORMAL HIGH (ref 0.8–1.0)
Prothrombin Time: 12 s (ref 9.6–13.1)

## 2021-10-25 LAB — VITAMIN D 25 HYDROXY (VIT D DEFICIENCY, FRACTURES): VITD: 26.28 ng/mL — ABNORMAL LOW (ref 30.00–100.00)

## 2021-10-25 NOTE — Patient Instructions (Addendum)
If you are age 41 or younger, your body mass index should be between 19-25. Your Body mass index is 31.96 kg/m. If this is out of the aformentioned range listed, please consider follow up with your Primary Care Provider.  ________________________________________________________  The [facility] providers would like to encourage you to use [facility] to communicate with providers for non-urgent requests or questions.  Due to long hold times on the telephone, sending your provider a message by [facility] may be a faster and more efficient way to get a response.  Please allow 48 business hours for a response.  Please remember that this is for non-urgent requests.  _______________________________________________________  Your provider has requested that you go to the basement level for lab work before leaving today. Press "B" on the elevator. The lab is located at the first door on the left as you exit the elevator.  Due to recent changes in healthcare laws, you may see the results of your imaging and laboratory studies on MyChart before your provider has had a chance to review them.  We understand that in some cases there may be results that are confusing or concerning to you. Not all laboratory results come back in the same time frame and the provider may be waiting for multiple results in order to interpret others.  Please give [place] 48 hours in order for your provider to thoroughly review all the results before contacting the office for clarification of your results.   You have been scheduled for a bone density test on _________ at __________. Please arrive 15 minutes prior to your scheduled appointment to radiology on the basement floor of [facility] location for this test. If you need to cancel or reschedule for any reason, please contact radiology at [phone].  Preparation for test is as follows:  If you are taking calcium, discontinue this 24-48 hours prior to your appointment.  Wear  pants with an elastic waistband (or without any metal such as a zipper).  Do not wear an underwire bra.  We do have gowns if you are unable to find appropriate clothing without metal.  Please bring a list of all current medications.  You will need to follow up in our office in 1 year.  ( Feb 2024)  We will contact you to schedule this appointment.  Thank you for entrusting me with your care and choosing [facility].  Dr Christella Hartigan

## 2021-10-25 NOTE — Progress Notes (Signed)
Review of pertinent gastrointestinal problems: 1. Primary biliary cholangitis, presented with Elevated liver tests (intermittent alk phos and GGT elevations, low level, all other liver tests including coags and platelets were normal), asymptomatic:  Lab workup 09/2017: Hepatitis A total antibody reactive, hepatitis B surface antigen negative, hepatitis B surface antibody reactive, hepatitis C antibody negative, iron studies normal, ANA negative, anti-smooth muscle antibody negative, alpha-1 antitrypsin normal, ceruloplasmin normal, TTG normal, IgA level normal.  Antimitochondrial antibody very elevated. Ultrasound 05/2017 normal Referral to [facility] outpatient liver clinic: May 2019 visit with Roosevelt Locks: " She does not have indication to initiate treatment with ursodiol as her alkaline phosphatase is within normal limits" and she is asymptomatic. Follow up 3 month appt recommended. Repeat labs September 2019 all liver tests completely normal including AST, ALT, alk phos and bilirubin.   Immune to hepatitis A and hepatitis B 2019 labs Bone density November 2020 was normal  HPI: This is a very pleasant 41 year old woman with primary biliary cholangitis  Complete metabolic profile December 2022 was absolutely normal except for ALT slightly elevated at 40.  She feels well overall.  She has had quite a lot of stressors recently with both of her daughters being involved in car accidents.  1 had an upper extremity injury, the other was not injured.  She has been eating more and has been gaining weight because of that.  She gets itchy skin last her 24 to 36 hours every few months.  Never last longer than that.  She does not have jaundice, no serious abdominal pains.  No significant swelling in her ankles or in her abdomen.  She takes a multivitamin once daily  ROS: complete GI ROS as described in HPI, all other review negative.  Constitutional:  No unintentional weight loss   Past  Medical History:  Diagnosis Date   Arthritis    Hypertension    Migraine    Primary biliary cirrhosis (HCC)    PVC (premature ventricular contraction)     Past Surgical History:  Procedure Laterality Date   TUBAL LIGATION  2008    Current Outpatient Medications  Medication Sig Dispense Refill   amLODipine (NORVASC) 10 MG tablet Take 1 tablet (10 mg total) by mouth daily. 90 tablet 1   atorvastatin (LIPITOR) 40 MG tablet Take 1 tablet (40 mg total) by mouth daily. 90 tablet 1   cyclobenzaprine (FLEXERIL) 10 MG tablet Take 1 tablet (10 mg total) by mouth 3 (three) times daily as needed for muscle spasms. 30 tablet 0   losartan (COZAAR) 50 MG tablet TAKE 1 TABLET BY MOUTH ONCE A DAY 90 tablet 1   Multiple Vitamins-Minerals (MULTIVITAL) tablet Take 1 tablet by mouth daily. WITH POTASSIUM     propranolol (INDERAL) 40 MG tablet Take 1 tablet (40 mg total) by mouth 2 (two) times daily. 60 tablet 1   No current facility-administered medications for this visit.    Allergies as of 10/25/2021   (No Known Allergies)    Family History  Problem Relation Age of Onset   Hyperlipidemia Mother    Hypertension Mother    Cancer Maternal Grandmother        lung   Hyperlipidemia Maternal Grandmother    Hypertension Maternal Grandmother    Hyperlipidemia Maternal Grandfather    Depression Daughter    Hypertension Sister    Hypertension Brother    Colon cancer Neg Hx     Social History   Socioeconomic History   Marital status: Married  Spouse name: Not on file   Number of children: 3   Years of education: Not on file   Highest education level: Not on file  Occupational History   Occupation: CMA  Tobacco Use   Smoking status: Never   Smokeless tobacco: Never  Vaping Use   Vaping Use: Never used  Substance and Sexual Activity   Alcohol use: No    Alcohol/week: 0.0 standard drinks   Drug use: No   Sexual activity: Not on file    Comment: tubal ligation  Other Topics Concern    Not on file  Social History Narrative   Married   3 Children   2002- daughter Theadore Nan   2004- Cathryn   2008Lorayne Marek   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)   Completed associates degree   Enjoys drawing   Social Determinants of Health   Financial Resource Strain: Not on file  Food Insecurity: Not on file  Transportation Needs: Not on file  Physical Activity: Not on file  Stress: Not on file  Social Connections: Not on file  Intimate Partner Violence: Not on file     Physical Exam: BP 136/88    Pulse (!) 105    Ht $R'5\' 1"'wz$  (1.549 m)    Wt 169 lb 2 oz (76.7 kg)    SpO2 96%    BMI 31.96 kg/m  Constitutional: generally well-appearing Psychiatric: alert and oriented x3 Abdomen: soft, nontender, nondistended, no obvious ascites, no peritoneal signs, normal bowel sounds No peripheral edema noted in lower extremities  Assessment and plan: 42 y.o. female with primary biliary cholangitis,  No signs clinically of cirrhosis.  She needs repeat labs today including CBC, complete metabolic profile, vitamin A, vitamin D, coags.  Also bone density test.  She will need a bone density test every 2 years.  Blood work about every 6 months.  Return office visit with me in 1 year.  Please see the "Patient Instructions" section for addition details about the plan.  Owens Loffler, MD [facility] 10/25/2021, 3:42 PM   Total time on date of encounter was 25 minutes (this included time spent preparing to see the patient reviewing records; obtaining and/or reviewing separately obtained history; performing a medically appropriate exam and/or evaluation; counseling and educating the patient and family if present; ordering medications, tests or procedures if applicable; and documenting clinical information in the health record).

## 2021-10-28 LAB — VITAMIN A: Vitamin A (Retinoic Acid): 50 ug/dL (ref 38–98)

## 2021-11-02 ENCOUNTER — Other Ambulatory Visit: Payer: Self-pay | Admitting: Obstetrics and Gynecology

## 2021-11-02 DIAGNOSIS — R928 Other abnormal and inconclusive findings on diagnostic imaging of breast: Secondary | ICD-10-CM

## 2021-11-19 MED FILL — Atorvastatin Calcium Tab 40 MG (Base Equivalent): ORAL | 90 days supply | Qty: 90 | Fill #1 | Status: AC

## 2021-11-20 ENCOUNTER — Other Ambulatory Visit (HOSPITAL_COMMUNITY): Payer: Self-pay

## 2021-11-24 ENCOUNTER — Ambulatory Visit: Payer: 59 | Admitting: Family

## 2021-11-24 ENCOUNTER — Other Ambulatory Visit: Payer: 59

## 2021-12-29 ENCOUNTER — Ambulatory Visit: Payer: 59

## 2021-12-29 ENCOUNTER — Encounter: Payer: Self-pay | Admitting: Family

## 2021-12-29 ENCOUNTER — Other Ambulatory Visit: Payer: 59

## 2021-12-29 ENCOUNTER — Other Ambulatory Visit (HOSPITAL_COMMUNITY): Payer: Self-pay

## 2021-12-29 ENCOUNTER — Ambulatory Visit: Payer: 59 | Admitting: Family

## 2021-12-29 VITALS — BP 125/87 | HR 79 | Temp 98.5°F | Resp 16 | Wt 173.0 lb

## 2021-12-29 DIAGNOSIS — Z309 Encounter for contraceptive management, unspecified: Secondary | ICD-10-CM | POA: Diagnosis not present

## 2021-12-29 DIAGNOSIS — E785 Hyperlipidemia, unspecified: Secondary | ICD-10-CM

## 2021-12-29 DIAGNOSIS — R739 Hyperglycemia, unspecified: Secondary | ICD-10-CM | POA: Diagnosis not present

## 2021-12-29 DIAGNOSIS — R7303 Prediabetes: Secondary | ICD-10-CM | POA: Insufficient documentation

## 2021-12-29 DIAGNOSIS — I10 Essential (primary) hypertension: Secondary | ICD-10-CM | POA: Diagnosis not present

## 2021-12-29 DIAGNOSIS — E119 Type 2 diabetes mellitus without complications: Secondary | ICD-10-CM | POA: Insufficient documentation

## 2021-12-29 LAB — BASIC METABOLIC PANEL
BUN: 11 mg/dL (ref 6–23)
CO2: 24 mEq/L (ref 19–32)
Calcium: 8.9 mg/dL (ref 8.4–10.5)
Chloride: 104 mEq/L (ref 96–112)
Creatinine, Ser: 0.62 mg/dL (ref 0.40–1.20)
GFR: 110.86 mL/min (ref >60.00)
Glucose, Bld: 97 mg/dL (ref 70–99)
Potassium: 4.1 mEq/L (ref 3.5–5.1)
Sodium: 137 mEq/L (ref 135–145)

## 2021-12-29 LAB — HEMOGLOBIN A1C: Hgb A1c MFr Bld: 6.3 % (ref 4.6–6.5)

## 2021-12-29 MED ORDER — LOSARTAN POTASSIUM 50 MG PO TABS
ORAL_TABLET | Freq: Every day | ORAL | 1 refills | Status: DC
  Filled 2021-12-29: qty 90, fill #0
  Filled 2022-02-25: qty 90, 90d supply, fill #0
  Filled 2022-04-29: qty 90, 90d supply, fill #1

## 2021-12-29 MED ORDER — PROPRANOLOL HCL 40 MG PO TABS
40.0000 mg | ORAL_TABLET | Freq: Two times a day (BID) | ORAL | 1 refills | Status: DC
  Filled 2021-12-29 – 2022-01-01 (×2): qty 180, 90d supply, fill #0
  Filled 2022-02-25 – 2022-04-03 (×2): qty 180, 90d supply, fill #1

## 2021-12-29 MED ORDER — ATORVASTATIN CALCIUM 40 MG PO TABS
40.0000 mg | ORAL_TABLET | Freq: Every day | ORAL | 1 refills | Status: DC
  Filled 2021-12-29 – 2022-02-25 (×2): qty 90, 90d supply, fill #0
  Filled 2022-04-29 – 2022-05-31 (×2): qty 90, 90d supply, fill #1

## 2021-12-29 NOTE — Assessment & Plan Note (Signed)
Last lipid panel at goal. Continue atorvastatin 40mg .  ?

## 2021-12-29 NOTE — Assessment & Plan Note (Signed)
BP is at goal. Continue amlodipine, losartan, and inderal.  ?

## 2021-12-29 NOTE — Progress Notes (Signed)
? ?Subjective:  ? ? ? Patient ID: Alyssa Berry, female    DOB: 06-16-1981, 41 y.o.   MRN: [id number] ? ?Chief Complaint  ?Patient presents with  ? Hypertension  ?  Here for follow up  ? Contraception  ?  Will like to discuss contraception management  ? ? ?Hypertension ? ?Patient is in today for follow up. ? ?HTN- maintained on amlodipine 10 mg, losartan 25mg , inderal 40 mg.  ?BP Readings from Last 3 Encounters:  ?12/29/21 125/87  ?10/25/21 136/88  ?08/25/21 (!) 142/95  ? ?Contraceptive management- States she has been on Depo but her liver specialist is recommending that she consider alternative method.  ? ?Hyperlipidemia-  ?Lab Results  ?Component Value Date  ? CHOL 152 08/25/2021  ? HDL 44.60 08/25/2021  ? LDLCALC 89 08/25/2021  ? TRIG 92.0 08/25/2021  ? CHOLHDL 3 08/25/2021  ? ?Maintained on atorvastatin 40mg .  ? ? ?Contraceptive management- ? ?Health Maintenance Due  ?Topic Date Due  ? COVID-19 Vaccine (4 - Booster for [employer] series) 11/11/2020  ? ? ?Past Medical History:  ?Diagnosis Date  ? Arthritis   ? Hypertension   ? Migraine   ? Primary biliary cirrhosis (HCC)   ? PVC (premature ventricular contraction)   ? ? ?Past Surgical History:  ?Procedure Laterality Date  ? TUBAL LIGATION  2008  ? ? ?Family History  ?Problem Relation Age of Onset  ? Hyperlipidemia Mother   ? Hypertension Mother   ? Cancer Maternal Grandmother   ?     lung  ? Hyperlipidemia Maternal Grandmother   ? Hypertension Maternal Grandmother   ? Hyperlipidemia Maternal Grandfather   ? Depression Daughter   ? Hypertension Sister   ? Hypertension Brother   ? Colon cancer Neg Hx   ? ? ?Social History  ? ?Socioeconomic History  ? Marital status: Married  ?  Spouse name: Not on file  ? Number of children: 3  ? Years of education: Not on file  ? Highest education level: Not on file  ?Occupational History  ? Occupation: CMA  ?Tobacco Use  ? Smoking status: Never  ? Smokeless tobacco: Never  ?Vaping Use  ? Vaping Use: Never used  ?Substance and  Sexual Activity  ? Alcohol use: No  ?  Alcohol/week: 0.0 standard drinks  ? Drug use: No  ? Sexual activity: Not on file  ?  Comment: tubal ligation  ?Other Topics Concern  ? Not on file  ?Social History Narrative  ? Married  ? 3 Children  ? 2002- daughter 2009  ? 2004- Alyssa Berry  ? 2008- Alyssa Berry  ? 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 Completed associates degree  ? Enjoys drawing  ? ?Social Determinants of Health  ? ?Financial Resource Strain: Not on file  ?Food Insecurity: Not on file  ?Transportation Needs: Not on file  ?Physical Activity: Not on file  ?Stress: Not on file  ?Social Connections: Not on file  ?Intimate Partner Violence: Not on file  ? ? ?Outpatient Medications Prior to Visit  ?Medication Sig Dispense Refill  ? amLODipine (NORVASC) 10 MG tablet Take 1 tablet (10 mg total) by mouth daily. 90 tablet 1  ? cyclobenzaprine (FLEXERIL) 10 MG tablet Take 1 tablet (10 mg total) by mouth 3 (three) times daily as needed for muscle spasms. 30 tablet 0  ? Multiple Vitamins-Minerals (MULTIVITAL) tablet Take 1 tablet by mouth daily. WITH POTASSIUM    ? atorvastatin (LIPITOR) 40 MG tablet Take 1  tablet (40 mg total) by mouth daily. 90 tablet 1  ? losartan (COZAAR) 50 MG tablet TAKE 1 TABLET BY MOUTH ONCE A DAY 90 tablet 1  ? propranolol (INDERAL) 40 MG tablet Take 1 tablet (40 mg total) by mouth 2 (two) times daily. 60 tablet 1  ? ?No facility-administered medications prior to visit.  ? ? ?No Known Allergies ? ?Review of Systems  ?Cardiovascular:  Negative for leg swelling.  ? ?   ?Objective:  ?  ?Physical Exam ?Constitutional:   ?   General: She is not in acute distress. ?   Appearance: Normal appearance. She is well-developed.  ?HENT:  ?   Head: Normocephalic and atraumatic.  ?   Right Ear: External ear normal.  ?   Left Ear: External ear normal.  ?Eyes:  ?   General: No scleral icterus. ?Neck:  ?   Thyroid: No thyromegaly.  ?Cardiovascular:  ?   Rate and Rhythm: Normal rate and regular rhythm.  ?    Heart sounds: Normal heart sounds. No murmur heard. ?Pulmonary:  ?   Effort: Pulmonary effort is normal. No respiratory distress.  ?   Breath sounds: Normal breath sounds. No wheezing.  ?Musculoskeletal:  ?   Cervical back: Neck supple.  ?Skin: ?   General: Skin is warm and dry.  ?Neurological:  ?   Mental Status: She is alert and oriented to person, place, and time.  ?Psychiatric:     ?   Mood and Affect: Mood normal.     ?   Behavior: Behavior normal.     ?   Thought Content: Thought content normal.     ?   Judgment: Judgment normal.  ? ? ?BP 125/87 (BP Location: Right Arm, Patient Position: Sitting, Cuff Size: Small)   Pulse 79   Temp 98.5 ?F (36.9 ?C) (Oral)   Resp 16   Wt 173 lb (78.5 kg)   SpO2 99%   BMI 32.69 kg/m?  ?Wt Readings from Last 3 Encounters:  ?12/29/21 173 lb (78.5 kg)  ?10/25/21 169 lb 2 oz (76.7 kg)  ?08/25/21 172 lb 3.2 oz (78.1 kg)  ? ? ?   ?Assessment & Plan:  ? ?Problem List Items Addressed This Visit   ? ?  ? Unprioritized  ? Primary hypertension  ?  BP is at goal. Continue amlodipine, losartan, and inderal.  ? ?  ?  ? Relevant Medications  ? propranolol (INDERAL) 40 MG tablet  ? atorvastatin (LIPITOR) 40 MG tablet  ? losartan (COZAAR) 50 MG tablet  ? Hyperlipidemia  ?  Last lipid panel at goal. Continue atorvastatin 40mg .  ? ?  ?  ? Relevant Medications  ? propranolol (INDERAL) 40 MG tablet  ? atorvastatin (LIPITOR) 40 MG tablet  ? losartan (COZAAR) 50 MG tablet  ? Hyperglycemia - Primary  ?  Noted on recent lab work. Obtain A1C.  ? ?  ?  ? Relevant Orders  ? Basic metabolic panel  ? Hemoglobin A1c  ? Encounter for contraceptive management  ?  Recommended that pt follow up with her GYN to discuss other options. IUD may be a possibility.  ? ?  ?  ?Recommended that she consider bivalent covid booster at her local pharmacy.  ? ?I am having Alyssa Berry "Alyssa Berry" maintain her Multivital, cyclobenzaprine, amLODipine, propranolol, atorvastatin, and losartan. ? ?Meds ordered this  encounter  ?Medications  ? propranolol (INDERAL) 40 MG tablet  ?  Sig: Take 1 tablet (40 mg total) by mouth  2 (two) times daily.  ?  Dispense:  180 tablet  ?  Refill:  1  ?  Order Specific Question:   Supervising Provider  ?  Answer:   Danise Edge A [[id number]]  ? atorvastatin (LIPITOR) 40 MG tablet  ?  Sig: Take 1 tablet (40 mg total) by mouth daily.  ?  Dispense:  90 tablet  ?  Refill:  1  ?  Order Specific Question:   Supervising Provider  ?  Answer:   Danise Edge A [[id number]]  ? losartan (COZAAR) 50 MG tablet  ?  Sig: TAKE 1 TABLET BY MOUTH ONCE A DAY  ?  Dispense:  90 tablet  ?  Refill:  1  ?  Order Specific Question:   Supervising Provider  ?  Answer:   Danise Edge A [[id number]]  ? ?

## 2021-12-29 NOTE — Assessment & Plan Note (Signed)
Recommended that pt follow up with her GYN to discuss other options. IUD may be a possibility.  ?

## 2021-12-29 NOTE — Patient Instructions (Signed)
Please complete lab work prior to leaving.   

## 2021-12-29 NOTE — Assessment & Plan Note (Signed)
Noted on recent lab work. Obtain A1C.  ?

## 2022-01-02 ENCOUNTER — Other Ambulatory Visit (HOSPITAL_COMMUNITY): Payer: Self-pay

## 2022-02-02 ENCOUNTER — Ambulatory Visit
Admission: RE | Admit: 2022-02-02 | Discharge: 2022-02-02 | Disposition: A | Discharge status: Home / Self Care | Payer: 59 | Source: Ambulatory Visit | Attending: Obstetrics and Gynecology | Admitting: Obstetrics and Gynecology

## 2022-02-02 ENCOUNTER — Ambulatory Visit: Payer: 59

## 2022-02-02 ENCOUNTER — Other Ambulatory Visit: Payer: Self-pay | Admitting: Obstetrics and Gynecology

## 2022-02-02 DIAGNOSIS — R928 Other abnormal and inconclusive findings on diagnostic imaging of breast: Secondary | ICD-10-CM

## 2022-02-02 DIAGNOSIS — N6311 Unspecified lump in the right breast, upper outer quadrant: Secondary | ICD-10-CM | POA: Diagnosis not present

## 2022-02-02 DIAGNOSIS — R922 Inconclusive mammogram: Secondary | ICD-10-CM | POA: Diagnosis not present

## 2022-02-26 ENCOUNTER — Other Ambulatory Visit (HOSPITAL_COMMUNITY): Payer: Self-pay

## 2022-03-20 ENCOUNTER — Ambulatory Visit: Payer: 59 | Admitting: Family

## 2022-03-20 ENCOUNTER — Other Ambulatory Visit (HOSPITAL_COMMUNITY): Payer: Self-pay

## 2022-03-20 DIAGNOSIS — Z3040 Encounter for surveillance of contraceptives, unspecified: Secondary | ICD-10-CM | POA: Diagnosis not present

## 2022-03-20 MED ORDER — LO LOESTRIN FE 1 MG-10 MCG / 10 MCG PO TABS
1.0000 | ORAL_TABLET | Freq: Every day | ORAL | 5 refills | Status: DC
  Filled 2022-03-20: qty 84, 84d supply, fill #0
  Filled 2022-04-29 – 2022-06-11 (×2): qty 84, 84d supply, fill #1
  Filled 2022-09-02: qty 84, 84d supply, fill #2
  Filled 2022-11-21: qty 84, 84d supply, fill #3
  Filled 2023-02-14: qty 84, 84d supply, fill #4

## 2022-03-26 ENCOUNTER — Telehealth: Payer: 59 | Admitting: Physician Assistant

## 2022-03-26 ENCOUNTER — Other Ambulatory Visit (HOSPITAL_COMMUNITY): Payer: Self-pay

## 2022-03-26 DIAGNOSIS — J069 Acute upper respiratory infection, unspecified: Secondary | ICD-10-CM | POA: Diagnosis not present

## 2022-03-26 MED ORDER — IPRATROPIUM BROMIDE 0.03 % NA SOLN
2.0000 | Freq: Two times a day (BID) | NASAL | 0 refills | Status: DC
  Filled 2022-03-26: qty 30, 43d supply, fill #0

## 2022-03-26 MED ORDER — BENZONATATE 100 MG PO CAPS
100.0000 mg | ORAL_CAPSULE | Freq: Three times a day (TID) | ORAL | 0 refills | Status: DC | PRN
  Filled 2022-03-26: qty 30, 10d supply, fill #0

## 2022-03-26 NOTE — Progress Notes (Signed)
E-Visit for Upper Respiratory Infection   We are sorry you are not feeling well.  Here is how we plan to help!  Based on what you have shared with me, it looks like you may have a viral upper respiratory infection.  Upper respiratory infections are caused by a large number of viruses; however, rhinovirus is the most common cause.   Symptoms vary from person to person, with common symptoms including sore throat, cough, fatigue or lack of energy and feeling of general discomfort.  A low-grade fever of up to 100.4 may present, but is often uncommon.  Symptoms vary however, and are closely related to a person's age or underlying illnesses.  The most common symptoms associated with an upper respiratory infection are nasal discharge or congestion, cough, sneezing, headache and pressure in the ears and face.  These symptoms usually persist for about 3 to 10 days, but can last up to 2 weeks.  It is important to know that upper respiratory infections do not cause serious illness or complications in most cases.    Upper respiratory infections can be transmitted from person to person, with the most common method of transmission being a person's hands.  The virus is able to live on the skin and can infect other persons for up to 2 hours after direct contact.  Also, these can be transmitted when someone coughs or sneezes; thus, it is important to cover the mouth to reduce this risk.  To keep the spread of the illness at [place], good hand hygiene is very important.  This is an infection that is most likely caused by a virus. There are no specific treatments other than to help you with the symptoms until the infection runs its course.  We are sorry you are not feeling well.  Here is how we plan to help!   For nasal congestion, you may use an oral decongestants such as Mucinex D or if you have glaucoma or high blood pressure use plain Mucinex.  Saline nasal spray or nasal drops can help and can safely be used as often as  needed for congestion.  For your congestion, I have prescribed Ipratropium Bromide nasal spray 0.03% two sprays in each nostril 2-3 times a day  If you do not have a history of heart disease, hypertension, diabetes or thyroid disease, prostate/bladder issues or glaucoma, you may also use Sudafed to treat nasal congestion.  It is highly recommended that you consult with a pharmacist or your primary care physician to ensure this medication is safe for you to take.     If you have a cough, you may use cough suppressants such as Delsym and Robitussin.  If you have glaucoma or high blood pressure, you can also use Coricidin HBP.   For cough I have prescribed for you A prescription cough medication called Tessalon Perles 100 mg. You may take 1-2 capsules every 8 hours as needed for cough  If you have a sore or scratchy throat, use a saltwater gargle-  to  teaspoon of salt dissolved in a 4-ounce to 8-ounce glass of warm water.  Gargle the solution for approximately 15-30 seconds and then spit.  It is important not to swallow the solution.  You can also use throat lozenges/cough drops and Chloraseptic spray to help with throat pain or discomfort.  Warm or cold liquids can also be helpful in relieving throat pain.  For headache, pain or general discomfort, you can use Ibuprofen or Tylenol as directed.  Some authorities believe that zinc sprays or the use of Echinacea may shorten the course of your symptoms.  A work note has been provided and will be under the "letters" tab.   HOME CARE Only take medications as instructed by your medical team. Be sure to drink plenty of fluids. Water is fine as well as fruit juices, sodas and electrolyte beverages. You may want to stay away from caffeine or alcohol. If you are nauseated, try taking small sips of liquids. How do you know if you are getting enough fluid? Your urine should be a pale yellow or almost colorless. Get rest. Taking a steamy shower or using a  humidifier may help nasal congestion and ease sore throat pain. You can place a towel over your head and breathe in the steam from hot water coming from a faucet. Using a saline nasal spray works much the same way. Cough drops, hard candies and sore throat lozenges may ease your cough. Avoid close contacts especially the very young and the elderly Cover your mouth if you cough or sneeze Always remember to wash your hands.   GET HELP RIGHT AWAY IF: You develop worsening fever. If your symptoms do not improve within 10 days You develop yellow or green discharge from your nose over 3 days. You have coughing fits You develop a severe head ache or visual changes. You develop shortness of breath, difficulty breathing or start having chest pain Your symptoms persist after you have completed your treatment plan  MAKE SURE YOU  Understand these instructions. Will watch your condition. Will get help right away if you are not doing well or get worse.  Thank you for choosing an e-visit.  Your e-visit answers were reviewed by a board certified advanced clinical practitioner to complete your personal care plan. Depending upon the condition, your plan could have included both over the counter or prescription medications.  Please review your pharmacy choice. Make sure the pharmacy is open so you can pick up prescription now. If there is a problem, you may contact your provider through [employer] and have the prescription routed to another pharmacy.  Your safety is important to [place]. If you have drug allergies check your prescription carefully.   For the next 24 hours you can use MyChart to ask questions about today's visit, request a non-urgent call back, or ask for a work or school excuse. You will get an email in the next two days asking about your experience. I hope that your e-visit has been valuable and will speed your recovery.  I provided 5 minutes of non face-to-face time during this  encounter for chart review and documentation.

## 2022-04-03 ENCOUNTER — Telehealth: Payer: 59 | Admitting: Physician Assistant

## 2022-04-03 ENCOUNTER — Other Ambulatory Visit (HOSPITAL_COMMUNITY): Payer: Self-pay

## 2022-04-03 DIAGNOSIS — B9689 Other specified bacterial agents as the cause of diseases classified elsewhere: Secondary | ICD-10-CM | POA: Diagnosis not present

## 2022-04-03 DIAGNOSIS — J019 Acute sinusitis, unspecified: Secondary | ICD-10-CM | POA: Diagnosis not present

## 2022-04-03 MED ORDER — AMOXICILLIN-POT CLAVULANATE 875-125 MG PO TABS
1.0000 | ORAL_TABLET | Freq: Two times a day (BID) | ORAL | 0 refills | Status: DC
  Filled 2022-04-03: qty 14, 7d supply, fill #0

## 2022-04-03 MED ORDER — FLUCONAZOLE 150 MG PO TABS
150.0000 mg | ORAL_TABLET | Freq: Once | ORAL | 0 refills | Status: AC
Start: 2022-04-03 — End: 2022-04-05
  Filled 2022-04-03: qty 1, 1d supply, fill #0

## 2022-04-03 NOTE — Progress Notes (Signed)

## 2022-04-03 NOTE — Progress Notes (Signed)
I have spent 5 minutes in review of e-visit questionnaire, review and updating patient chart, medical decision making and response to patient.   Rayen Palen Cody Tawn Fitzner, PA-C    

## 2022-04-29 ENCOUNTER — Other Ambulatory Visit: Payer: Self-pay | Admitting: Family

## 2022-04-30 ENCOUNTER — Ambulatory Visit: Payer: 59 | Admitting: Family

## 2022-04-30 ENCOUNTER — Other Ambulatory Visit (HOSPITAL_COMMUNITY): Payer: Self-pay

## 2022-04-30 VITALS — BP 131/96 | HR 79 | Temp 98.3°F | Resp 16 | Wt 169.0 lb

## 2022-04-30 DIAGNOSIS — K743 Primary biliary cirrhosis: Secondary | ICD-10-CM

## 2022-04-30 DIAGNOSIS — I10 Essential (primary) hypertension: Secondary | ICD-10-CM

## 2022-04-30 DIAGNOSIS — E785 Hyperlipidemia, unspecified: Secondary | ICD-10-CM | POA: Diagnosis not present

## 2022-04-30 DIAGNOSIS — R7303 Prediabetes: Secondary | ICD-10-CM

## 2022-04-30 DIAGNOSIS — R739 Hyperglycemia, unspecified: Secondary | ICD-10-CM

## 2022-04-30 DIAGNOSIS — Z0184 Encounter for antibody response examination: Secondary | ICD-10-CM | POA: Diagnosis not present

## 2022-04-30 LAB — COMPREHENSIVE METABOLIC PANEL
ALT: 23 U/L (ref 0–35)
AST: 21 U/L (ref 0–37)
Albumin: 4.1 g/dL (ref 3.5–5.2)
Alkaline Phosphatase: 82 U/L (ref 39–117)
BUN: 9 mg/dL (ref 6–23)
CO2: 24 mEq/L (ref 19–32)
Calcium: 8.8 mg/dL (ref 8.4–10.5)
Chloride: 102 mEq/L (ref 96–112)
Creatinine, Ser: 0.58 mg/dL (ref 0.40–1.20)
GFR: 112.39 mL/min (ref >60.00)
Glucose, Bld: 91 mg/dL (ref 70–99)
Potassium: 3.8 mEq/L (ref 3.5–5.1)
Sodium: 139 mEq/L (ref 135–145)
Total Bilirubin: 0.3 mg/dL (ref 0.2–1.2)
Total Protein: 7.3 g/dL (ref 6.0–8.3)

## 2022-04-30 LAB — HEMOGLOBIN A1C: Hgb A1c MFr Bld: 6.3 % (ref 4.6–6.5)

## 2022-04-30 MED ORDER — PROPRANOLOL HCL 40 MG PO TABS
40.0000 mg | ORAL_TABLET | Freq: Two times a day (BID) | ORAL | 1 refills | Status: DC
  Filled 2022-04-30: qty 180, 90d supply, fill #0

## 2022-04-30 MED ORDER — CARVEDILOL 6.25 MG PO TABS
6.2500 mg | ORAL_TABLET | Freq: Two times a day (BID) | ORAL | 3 refills | Status: DC
Start: 2022-04-30 — End: 2022-06-04
  Filled 2022-04-30: qty 60, 30d supply, fill #0
  Filled 2022-05-31: qty 60, 30d supply, fill #1

## 2022-04-30 MED ORDER — AMLODIPINE BESYLATE 10 MG PO TABS
10.0000 mg | ORAL_TABLET | Freq: Every day | ORAL | 1 refills | Status: DC
  Filled 2022-04-30: qty 90, 90d supply, fill #0

## 2022-04-30 NOTE — Assessment & Plan Note (Signed)
Check follow up A1C, encouraged healthier diet/exercise.  Wt Readings from Last 3 Encounters:  04/30/22 169 lb (76.7 kg)  12/29/21 173 lb (78.5 kg)  10/25/21 169 lb 2 oz (76.7 kg)

## 2022-04-30 NOTE — Progress Notes (Signed)
Subjective:     Patient ID: Alyssa Berry, female    DOB: [birth year], 41 y.o.   MRN: [id number]  Chief Complaint  Patient presents with   Hypertension    Here for follow up    Hypertension   Patient is in today for follow up.  Hyperglycemia- Has been working a lot. Not exercising enough.   Lab Results  Component Value Date   HGBA1C 6.3 12/29/2021   Hx of Primary Biliary Cholangitis-She is following once a year with GI.   HTN- BP meds include losartan $RemoveBeforeDE'50mg'PsSSaNRqZisaOfA$ , propranolol $RemoveBefor'40mg'FePdtmeLWQtD$ , amlodipine $RemoveBefo'10mg'DIxReRYQcMd$ .  BP Readings from Last 3 Encounters:  04/30/22 (!) 131/96  12/29/21 125/87  10/25/21 136/88   Hyperlipidemia- maintained on atorvastatin $RemoveBeforeD'40mg'jUOfAdFnliwpXY$ .  Lab Results  Component Value Date   CHOL 152 08/25/2021   HDL 44.60 08/25/2021   LDLCALC 89 08/25/2021   TRIG 92.0 08/25/2021   CHOLHDL 3 08/25/2021   Contraceptive management- previously on depo, now on OCP.    Health Maintenance Due  Topic Date Due   COVID-19 Vaccine (4 - Pfizer series) 11/11/2020   INFLUENZA VACCINE  04/03/2022    Past Medical History:  Diagnosis Date   Arthritis    Borderline diabetes    Hypertension    Migraine    Primary biliary cirrhosis (HCC)    PVC (premature ventricular contraction)     Past Surgical History:  Procedure Laterality Date   TUBAL LIGATION  2008    Family History  Problem Relation Age of Onset   Hyperlipidemia Mother    Hypertension Mother    Cancer Maternal Grandmother        lung   Hyperlipidemia Maternal Grandmother    Hypertension Maternal Grandmother    Hyperlipidemia Maternal Grandfather    Depression Daughter    Hypertension Sister    Hypertension Brother    Colon cancer Neg Hx     Social History   Socioeconomic History   Marital status: Married    Spouse name: Not on file   Number of children: 3   Years of education: Not on file   Highest education level: Not on file  Occupational History   Occupation: CMA  Tobacco Use   Smoking status: Never    Smokeless tobacco: Never  Vaping Use   Vaping Use: Never used  Substance and Sexual Activity   Alcohol use: No    Alcohol/week: 0.0 standard drinks of alcohol   Drug use: No   Sexual activity: Not on file    Comment: tubal ligation  Other Topics Concern   Not on file  Social History Narrative   Married   3 Children   2002- daughter Alyssa Berry   2004- Alyssa Berry   2008Lorayne Berry   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 90 tablet 1   atorvastatin (LIPITOR) 40 MG tablet Take 1 tablet by mouth daily. 90 tablet 1   LO LOESTRIN FE 1 MG-10 MCG / 10 MCG tablet TAKE 1 TABLET  BY MOUTH ONCE DAILY 84 tablet 5   losartan (COZAAR) 50 MG tablet TAKE 1 TABLET BY MOUTH ONCE A DAY 90 tablet 1   Multiple Vitamins-Minerals (MULTIVITAL) tablet Take 1 tablet by mouth daily. WITH POTASSIUM     propranolol (INDERAL) 40 MG tablet Take 1 tablet by mouth 2 times daily. 180 tablet 1   amoxicillin-clavulanate (AUGMENTIN) 875-125 MG tablet Take 1 tablet by mouth 2 (two) times daily. 14 tablet 0   benzonatate (TESSALON) 100 MG capsule Take 1 capsule (100 mg total) by mouth 3 (three) times daily as needed. 30 capsule 0   ipratropium (ATROVENT) 0.03 % nasal spray Place 2 sprays into both nostrils every 12 (twelve) hours. 30 mL 0   No facility-administered medications prior to visit.    No Known Allergies  ROS See HPI    Objective:    Physical Exam Constitutional:      General: She is not in acute distress.    Appearance: Normal  appearance. She is well-developed.  HENT:     Head: Normocephalic and atraumatic.     Right Ear: External ear normal.     Left Ear: External ear normal.  Eyes:     General: No scleral icterus. Neck:     Thyroid: No thyromegaly.  Cardiovascular:     Rate and Rhythm: Normal rate and regular rhythm.     Heart sounds: Normal heart sounds. No murmur heard. Pulmonary:     Effort: Pulmonary effort is normal. No respiratory distress.     Breath sounds: Normal breath sounds. No wheezing.  Musculoskeletal:     Cervical back: Neck supple.  Skin:    General: Skin is warm and dry.  Neurological:     Mental Status: She is alert and oriented to person, place, and time.  Psychiatric:        Mood and Affect: Mood normal.        Behavior: Behavior normal.        Thought Content: Thought content normal.        Judgment: Judgment normal.     BP (!) 131/96 (BP Location: Right Arm, Patient Position: Sitting, Cuff Size: Small)   Pulse 79   Temp 98.3 F (36.8 C) (Oral)   Resp 16   Wt 169 lb (76.7 kg)   BMI 31.93 kg/m  Wt Readings from Last 3 Encounters:  04/30/22 169 lb (76.7 kg)  12/29/21 173 lb (78.5 kg)  10/25/21 169 lb 2 oz (76.7 kg)       Assessment & Plan:   Problem List Items Addressed This Visit       Unprioritized   Primary hypertension    Uncontrolled. DBP elevated. Recommended d/c inderal and begin carvedilol 6.25mg  bid. Continue current doses of amlodipine, losartan.       Relevant Medications   carvedilol (COREG) 6.25 MG tablet   Hyperlipidemia    Tolerating statin. LDL at goal.       Relevant Medications   carvedilol (COREG) 6.25 MG tablet   Hyperglycemia - Primary    Check follow up A1C, encouraged healthier diet/exercise.  Wt Readings from Last 3 Encounters:  04/30/22 169 lb (76.7 kg)  12/29/21 173 lb (78.5 kg)  10/25/21 169 lb 2 oz (76.7 kg)         Relevant Orders   Hemoglobin A1c   Borderline diabetes   Other Visit Diagnoses     Primary biliary  cholangitis ([place])       Relevant Orders   Comp  Met (CMET)   Immunity status testing       Relevant Orders   HBsAb Quant HBIG Assessment       I have discontinued Alyssa Berry "Alyssa Berry"'s propranolol, ipratropium, benzonatate, and amoxicillin-clavulanate. I am also having her start on carvedilol. Additionally, I am having her maintain her Multivital, amLODipine, atorvastatin, losartan, and Lo Loestrin Fe.  Meds ordered this encounter  Medications   carvedilol (COREG) 6.25 MG tablet    Sig: Take 1 tablet (6.25 mg total) by mouth 2 (two) times daily with a meal.    Dispense:  60 tablet    Refill:  3    Order Specific Question:   Supervising Provider    Answer:   Penni Homans A [[id number]]

## 2022-04-30 NOTE — Assessment & Plan Note (Signed)
Tolerating statin.  LDL at goal. 

## 2022-04-30 NOTE — Assessment & Plan Note (Signed)
Uncontrolled. DBP elevated. Recommended d/c inderal and begin carvedilol 6.25mg  bid. Continue current doses of amlodipine, losartan.

## 2022-04-30 NOTE — Patient Instructions (Signed)
Stop inderal, start carvedilol.

## 2022-05-01 LAB — HBSAB QUANT HBIG ASSESSMENT: HBsAb Quant HBIG Assessment: 25.8 m[IU]/mL

## 2022-05-03 ENCOUNTER — Encounter: Payer: Self-pay | Admitting: Family

## 2022-05-04 ENCOUNTER — Other Ambulatory Visit (HOSPITAL_COMMUNITY): Payer: Self-pay

## 2022-05-04 MED ORDER — LOSARTAN POTASSIUM 100 MG PO TABS
100.0000 mg | ORAL_TABLET | Freq: Every day | ORAL | 0 refills | Status: DC
  Filled 2022-05-04: qty 90, 90d supply, fill #0

## 2022-05-04 NOTE — Addendum Note (Signed)
Addended by: Sandford Craze on: 05/04/2022 08:59 AM   Modules accepted: Orders

## 2022-05-18 ENCOUNTER — Ambulatory Visit: Payer: 59 | Admitting: Family

## 2022-05-25 ENCOUNTER — Ambulatory Visit: Payer: 59 | Admitting: Family

## 2022-05-31 ENCOUNTER — Other Ambulatory Visit (HOSPITAL_COMMUNITY): Payer: Self-pay

## 2022-05-31 ENCOUNTER — Telehealth: Payer: Self-pay | Admitting: Gastroenterology

## 2022-05-31 DIAGNOSIS — K743 Primary biliary cirrhosis: Secondary | ICD-10-CM

## 2022-05-31 NOTE — Telephone Encounter (Signed)
Patient called in regarding some Labs "vitamin A, vitamin D,CBS that she need to get done and need a order for it.Please advise

## 2022-05-31 NOTE — Telephone Encounter (Signed)
The pt has been advised that the lab order has been entered. She will come in at her soonest convenience.

## 2022-06-01 ENCOUNTER — Ambulatory Visit: Payer: 59 | Admitting: Family

## 2022-06-04 ENCOUNTER — Encounter: Payer: Self-pay | Admitting: Family

## 2022-06-04 ENCOUNTER — Other Ambulatory Visit (HOSPITAL_COMMUNITY): Payer: Self-pay

## 2022-06-04 ENCOUNTER — Ambulatory Visit: Payer: 59 | Admitting: Family

## 2022-06-04 DIAGNOSIS — R7303 Prediabetes: Secondary | ICD-10-CM | POA: Diagnosis not present

## 2022-06-04 DIAGNOSIS — I10 Essential (primary) hypertension: Secondary | ICD-10-CM

## 2022-06-04 MED ORDER — METFORMIN HCL 500 MG PO TABS
500.0000 mg | ORAL_TABLET | Freq: Two times a day (BID) | ORAL | 3 refills | Status: DC
  Filled 2022-06-04: qty 60, 30d supply, fill #0
  Filled 2022-07-01: qty 60, 30d supply, fill #1
  Filled 2022-07-30: qty 60, 30d supply, fill #2
  Filled 2022-09-02: qty 60, 30d supply, fill #3

## 2022-06-04 MED ORDER — CARVEDILOL 12.5 MG PO TABS
12.5000 mg | ORAL_TABLET | Freq: Two times a day (BID) | ORAL | 3 refills | Status: DC
  Filled 2022-06-04: qty 60, 30d supply, fill #0
  Filled 2022-07-01: qty 60, 30d supply, fill #1
  Filled 2022-07-30: qty 60, 30d supply, fill #2
  Filled 2022-09-02: qty 60, 30d supply, fill #3

## 2022-06-04 NOTE — Assessment & Plan Note (Signed)
Lab Results  Component Value Date   HGBA1C 6.3 04/30/2022   HGBA1C 6.3 12/29/2021   Lab Results  Component Value Date   LDLCALC 89 08/25/2021   CREATININE 0.58 04/30/2022   Pt is interested in trial of metformin to see if this can help with sugars/weight loss. Will initiate 500mg  twice daily.

## 2022-06-04 NOTE — Addendum Note (Signed)
Addended by: Debbrah Alar on: 06/04/2022 01:02 PM   Modules accepted: Level of Service

## 2022-06-04 NOTE — Progress Notes (Addendum)
Subjective:   By signing my name below, I, Shehryar Baig, attest that this documentation has been prepared under the direction and in the presence of Sandford Craze, NP. 06/04/2022    Patient ID: Alyssa Berry, female    DOB: [birth year], 41 y.o.   MRN: [id number]  Chief Complaint  Patient presents with   Hypertension    B P follow up     HPI Patient is in today for a follow up visit.   Blood pressure- She switched from amlodipine to 6.25 mg carvedilol 2x daily PO during last visit. Her leg swelling has decreased since she switched but her blood pressure continues measuring high. She measures her blood pressure prior to going to sleep and in the morning. They typically range from 130/80 - 119/72.  Immunizations- She is receiving her flu vaccine at work. She was recommended to receive the new Covid-19 booster vaccine.    Health Maintenance Due  Topic Date Due   COVID-19 Vaccine (4 - Pfizer series) 11/11/2020   INFLUENZA VACCINE  04/03/2022    Past Medical History:  Diagnosis Date   Arthritis    Borderline diabetes    Hypertension    Migraine    Primary biliary cirrhosis (HCC)    PVC (premature ventricular contraction)     Past Surgical History:  Procedure Laterality Date   TUBAL LIGATION  2008    Family History  Problem Relation Age of Onset   Hyperlipidemia Mother    Hypertension Mother    Cancer Maternal Grandmother        lung   Hyperlipidemia Maternal Grandmother    Hypertension Maternal Grandmother    Hyperlipidemia Maternal Grandfather    Depression Daughter    Hypertension Sister    Hypertension Brother    Colon cancer Neg Hx     Social History   Socioeconomic History   Marital status: Married    Spouse name: Not on file   Number of children: 3   Years of education: Not on file   Highest education level: Not on file  Occupational History   Occupation: CMA  Tobacco Use   Smoking status: Never   Smokeless tobacco: Never  Vaping Use    Vaping Use: Never used  Substance and Sexual Activity   Alcohol use: No    Alcohol/week: 0.0 standard drinks of alcohol   Drug use: No   Sexual activity: Not on file    Comment: tubal ligation  Other Topics Concern   Not on file  Social History Narrative   Married   3 Children   2002- daughter Otho Ket   2004- Cathryn   2008Alphonzo Lemmings   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 90 tablet 1   LO LOESTRIN FE 1 MG-10 MCG / 10 MCG tablet TAKE 1 TABLET BY MOUTH ONCE DAILY 84 tablet 5   losartan (COZAAR) 100 MG tablet Take 1 tablet (100 mg total) by mouth daily. 90 tablet 0   Multiple Vitamins-Minerals (MULTIVITAL)  tablet Take 1 tablet by mouth daily. WITH POTASSIUM     carvedilol (COREG) 6.25 MG tablet Take 1 tablet (6.25 mg total) by mouth 2 (two) times daily with a meal. 60 tablet 3   amLODipine (NORVASC) 10 MG tablet Take 1 tablet (10 mg total) by mouth daily. 90 tablet 1   No facility-administered medications prior to visit.    No Known Allergies  ROS     Objective:    Physical Exam Constitutional:      General: She is not in acute distress.    Appearance: Normal appearance. She is not ill-appearing.  HENT:     Head: Normocephalic and atraumatic.     Right Ear: External ear normal.     Left Ear: External ear normal.  Eyes:     Extraocular Movements: Extraocular movements intact.     Pupils: Pupils are equal, round, and reactive to light.  Cardiovascular:     Rate and Rhythm: Normal rate and regular rhythm.      Heart sounds: Normal heart sounds. No murmur heard.    No gallop.     Comments: Blood pressure measured 145/100 during manual recheck Pulmonary:     Effort: Pulmonary effort is normal. No respiratory distress.     Breath sounds: Normal breath sounds. No wheezing or rales.  Skin:    General: Skin is warm and dry.  Neurological:     Mental Status: She is alert and oriented to person, place, and time.  Psychiatric:        Judgment: Judgment normal.     BP (!) 145/100   Pulse 85   Temp 98.1 F (36.7 C) (Oral)   Ht 5\' 1"  (1.549 m)   Wt 168 lb 12.8 oz (76.6 kg)   SpO2 98%   BMI 31.89 kg/m  Wt Readings from Last 3 Encounters:  06/04/22 168 lb 12.8 oz (76.6 kg)  04/30/22 169 lb (76.7 kg)  12/29/21 173 lb (78.5 kg)       Assessment & Plan:   Problem List Items Addressed This Visit       Unprioritized   Primary hypertension    BP Readings from Last 3 Encounters:  06/04/22 (!) 150/100  04/30/22 (!) 131/96  12/29/21 125/87  Home bp readings are much better than office readings.  Recommended that she check her blood pressure at home this evening and send me her reading via mychart. Continue current dose of carvedilol and losartan.   Addendum: home reading 138/100.  See phone note. Recommended that the patient increase carvedilol to 12.5mg  bid.       Borderline diabetes    Lab Results  Component Value Date   HGBA1C 6.3 04/30/2022   HGBA1C 6.3 12/29/2021   Lab Results  Component Value Date   LDLCALC 89 08/25/2021   CREATININE 0.58 04/30/2022  Pt is interested in trial of metformin to see if this can help with sugars/weight loss. Will initiate 500mg  twice daily.         No orders of the defined types were placed in this encounter.   I, Nance Pear, NP, personally preformed the services described in this documentation.  All medical record entries made by the scribe were at my direction and in my presence.  I have reviewed the chart and discharge  instructions (if applicable) and agree that the record reflects my personal performance and is accurate and complete. 06/04/2022   I,Shehryar Baig,acting as a scribe for Nance Pear, NP.,have documented all  relevant documentation on the behalf of Nance Pear, NP,as directed by  Nance Pear, NP while in the presence of Nance Pear, NP.   Nance Pear, NP

## 2022-06-04 NOTE — Assessment & Plan Note (Addendum)
BP Readings from Last 3 Encounters:  06/04/22 (!) 150/100  04/30/22 (!) 131/96  12/29/21 125/87   Home bp readings are much better than office readings.  Recommended that she check her blood pressure at home this evening and send me her reading via mychart. Continue current dose of carvedilol and losartan.   Addendum: home reading 138/100.  See phone note. Recommended that the patient increase carvedilol to 12.5mg  bid.

## 2022-06-10 ENCOUNTER — Encounter: Payer: Self-pay | Admitting: Family

## 2022-06-10 DIAGNOSIS — I10 Essential (primary) hypertension: Secondary | ICD-10-CM

## 2022-06-11 ENCOUNTER — Other Ambulatory Visit (HOSPITAL_COMMUNITY): Payer: Self-pay

## 2022-06-11 MED ORDER — VALSARTAN 320 MG PO TABS
320.0000 mg | ORAL_TABLET | Freq: Every day | ORAL | 2 refills | Status: DC
  Filled 2022-06-11: qty 30, 30d supply, fill #0
  Filled 2022-07-01 – 2022-07-05 (×2): qty 30, 30d supply, fill #1
  Filled 2022-08-05: qty 30, 30d supply, fill #2

## 2022-06-15 ENCOUNTER — Other Ambulatory Visit (INDEPENDENT_AMBULATORY_CARE_PROVIDER_SITE_OTHER): Payer: 59

## 2022-06-15 DIAGNOSIS — I10 Essential (primary) hypertension: Secondary | ICD-10-CM | POA: Diagnosis not present

## 2022-06-15 DIAGNOSIS — K743 Primary biliary cirrhosis: Secondary | ICD-10-CM | POA: Diagnosis not present

## 2022-06-15 LAB — BASIC METABOLIC PANEL
BUN: 12 mg/dL (ref 6–23)
CO2: 24 mEq/L (ref 19–32)
Calcium: 9.3 mg/dL (ref 8.4–10.5)
Chloride: 101 mEq/L (ref 96–112)
Creatinine, Ser: 0.6 mg/dL (ref 0.40–1.20)
GFR: 111.38 mL/min (ref >60.00)
Glucose, Bld: 82 mg/dL (ref 70–99)
Potassium: 3.9 mEq/L (ref 3.5–5.1)
Sodium: 136 mEq/L (ref 135–145)

## 2022-06-15 LAB — HEPATIC FUNCTION PANEL
ALT: 24 U/L (ref 0–35)
AST: 20 U/L (ref 0–37)
Albumin: 4.1 g/dL (ref 3.5–5.2)
Alkaline Phosphatase: 71 U/L (ref 39–117)
Bilirubin, Direct: 0.1 mg/dL (ref 0.0–0.3)
Total Bilirubin: 0.3 mg/dL (ref 0.2–1.2)
Total Protein: 7.9 g/dL (ref 6.0–8.3)

## 2022-06-15 LAB — VITAMIN D 25 HYDROXY (VIT D DEFICIENCY, FRACTURES): VITD: 36.9 ng/mL (ref 30.00–100.00)

## 2022-06-15 LAB — CALCIUM: Calcium: 9.3 mg/dL (ref 8.4–10.5)

## 2022-06-18 ENCOUNTER — Ambulatory Visit: Payer: 59 | Admitting: Family

## 2022-06-19 ENCOUNTER — Other Ambulatory Visit (HOSPITAL_COMMUNITY): Payer: Self-pay

## 2022-06-19 MED ORDER — HYDROCHLOROTHIAZIDE 25 MG PO TABS
25.0000 mg | ORAL_TABLET | Freq: Every day | ORAL | 3 refills | Status: DC
  Filled 2022-06-19: qty 30, 30d supply, fill #0
  Filled 2022-07-14: qty 30, 30d supply, fill #1
  Filled 2022-08-05 – 2022-08-07 (×2): qty 30, 30d supply, fill #2
  Filled 2022-09-16: qty 30, 30d supply, fill #3

## 2022-06-19 NOTE — Addendum Note (Signed)
Addended by: Debbrah Alar on: 06/19/2022 11:49 AM   Modules accepted: Orders

## 2022-06-29 ENCOUNTER — Other Ambulatory Visit: Payer: 59

## 2022-06-29 ENCOUNTER — Ambulatory Visit: Payer: 59 | Admitting: Family

## 2022-06-29 ENCOUNTER — Other Ambulatory Visit (INDEPENDENT_AMBULATORY_CARE_PROVIDER_SITE_OTHER): Payer: 59

## 2022-06-29 DIAGNOSIS — I10 Essential (primary) hypertension: Secondary | ICD-10-CM | POA: Diagnosis not present

## 2022-06-29 LAB — BASIC METABOLIC PANEL
BUN: 11 mg/dL (ref 6–23)
CO2: 26 mEq/L (ref 19–32)
Calcium: 9 mg/dL (ref 8.4–10.5)
Chloride: 99 mEq/L (ref 96–112)
Creatinine, Ser: 0.52 mg/dL (ref 0.40–1.20)
GFR: 115.26 mL/min (ref >60.00)
Glucose, Bld: 99 mg/dL (ref 70–99)
Potassium: 3.6 mEq/L (ref 3.5–5.1)
Sodium: 135 mEq/L (ref 135–145)

## 2022-07-02 ENCOUNTER — Other Ambulatory Visit (HOSPITAL_COMMUNITY): Payer: Self-pay

## 2022-07-05 ENCOUNTER — Encounter (HOSPITAL_COMMUNITY): Payer: Self-pay

## 2022-07-05 ENCOUNTER — Other Ambulatory Visit (HOSPITAL_COMMUNITY): Payer: Self-pay

## 2022-07-14 ENCOUNTER — Other Ambulatory Visit (HOSPITAL_COMMUNITY): Payer: Self-pay

## 2022-07-30 ENCOUNTER — Other Ambulatory Visit (HOSPITAL_COMMUNITY): Payer: Self-pay

## 2022-08-01 ENCOUNTER — Other Ambulatory Visit (HOSPITAL_COMMUNITY): Payer: Self-pay

## 2022-08-03 ENCOUNTER — Other Ambulatory Visit (HOSPITAL_COMMUNITY): Payer: Self-pay

## 2022-08-06 ENCOUNTER — Other Ambulatory Visit (HOSPITAL_COMMUNITY): Payer: Self-pay

## 2022-08-07 ENCOUNTER — Other Ambulatory Visit (HOSPITAL_COMMUNITY): Payer: Self-pay

## 2022-09-02 ENCOUNTER — Other Ambulatory Visit: Payer: Self-pay | Admitting: Family

## 2022-09-03 ENCOUNTER — Other Ambulatory Visit (HOSPITAL_COMMUNITY): Payer: Self-pay

## 2022-09-04 ENCOUNTER — Other Ambulatory Visit: Payer: Self-pay

## 2022-09-04 MED ORDER — VALSARTAN 320 MG PO TABS
320.0000 mg | ORAL_TABLET | Freq: Every day | ORAL | 1 refills | Status: DC
  Filled 2022-09-04 – 2022-09-05 (×2): qty 90, 90d supply, fill #0
  Filled 2022-11-27: qty 90, 90d supply, fill #1

## 2022-09-04 MED ORDER — ATORVASTATIN CALCIUM 40 MG PO TABS
40.0000 mg | ORAL_TABLET | Freq: Every day | ORAL | 1 refills | Status: DC
  Filled 2022-09-04: qty 90, 90d supply, fill #0
  Filled 2022-11-27: qty 90, 90d supply, fill #1

## 2022-09-05 ENCOUNTER — Other Ambulatory Visit: Payer: Self-pay

## 2022-09-05 ENCOUNTER — Other Ambulatory Visit (HOSPITAL_COMMUNITY): Payer: Self-pay

## 2022-09-05 ENCOUNTER — Encounter: Payer: Self-pay | Admitting: Family

## 2022-09-07 ENCOUNTER — Ambulatory Visit: Payer: Self-pay | Admitting: Family

## 2022-09-10 ENCOUNTER — Encounter: Payer: Self-pay | Admitting: Family

## 2022-09-11 ENCOUNTER — Ambulatory Visit: Payer: 59 | Admitting: Family

## 2022-09-11 ENCOUNTER — Encounter: Payer: Self-pay | Admitting: Family

## 2022-09-17 ENCOUNTER — Other Ambulatory Visit (HOSPITAL_COMMUNITY): Payer: Self-pay

## 2022-09-27 ENCOUNTER — Other Ambulatory Visit: Payer: Self-pay

## 2022-09-27 ENCOUNTER — Other Ambulatory Visit: Payer: Self-pay | Admitting: Family

## 2022-09-27 MED ORDER — CARVEDILOL 12.5 MG PO TABS
12.5000 mg | ORAL_TABLET | Freq: Two times a day (BID) | ORAL | 1 refills | Status: DC
  Filled 2022-09-27: qty 180, 90d supply, fill #0
  Filled 2022-12-28: qty 180, 90d supply, fill #1

## 2022-09-27 MED ORDER — METFORMIN HCL 500 MG PO TABS
500.0000 mg | ORAL_TABLET | Freq: Two times a day (BID) | ORAL | 1 refills | Status: DC
  Filled 2022-09-27: qty 180, 90d supply, fill #0
  Filled 2022-12-28: qty 180, 90d supply, fill #1

## 2022-09-28 ENCOUNTER — Ambulatory Visit: Payer: Commercial Managed Care - PPO

## 2022-10-10 ENCOUNTER — Other Ambulatory Visit: Payer: Self-pay | Admitting: Family

## 2022-10-11 ENCOUNTER — Other Ambulatory Visit: Payer: Self-pay

## 2022-10-11 ENCOUNTER — Other Ambulatory Visit (HOSPITAL_COMMUNITY): Payer: Self-pay

## 2022-10-11 MED ORDER — HYDROCHLOROTHIAZIDE 25 MG PO TABS
25.0000 mg | ORAL_TABLET | Freq: Every day | ORAL | 1 refills | Status: DC
  Filled 2022-10-11: qty 90, 90d supply, fill #0
  Filled 2023-01-10: qty 90, 90d supply, fill #1

## 2022-10-12 ENCOUNTER — Ambulatory Visit: Payer: Commercial Managed Care - PPO | Admitting: Family

## 2022-10-26 ENCOUNTER — Other Ambulatory Visit (HOSPITAL_COMMUNITY): Payer: Self-pay

## 2022-10-26 DIAGNOSIS — Z01419 Encounter for gynecological examination (general) (routine) without abnormal findings: Secondary | ICD-10-CM | POA: Diagnosis not present

## 2022-10-26 DIAGNOSIS — Z1389 Encounter for screening for other disorder: Secondary | ICD-10-CM | POA: Diagnosis not present

## 2022-10-26 DIAGNOSIS — Z3040 Encounter for surveillance of contraceptives, unspecified: Secondary | ICD-10-CM | POA: Diagnosis not present

## 2022-10-26 DIAGNOSIS — Z130 Encounter for screening for diseases of the blood and blood-forming organs and certain disorders involving the immune mechanism: Secondary | ICD-10-CM | POA: Diagnosis not present

## 2022-10-26 MED ORDER — LO LOESTRIN FE 1 MG-10 MCG / 10 MCG PO TABS
1.0000 | ORAL_TABLET | Freq: Every day | ORAL | 5 refills | Status: DC
  Filled 2022-10-26: qty 84, 84d supply, fill #0

## 2022-10-31 ENCOUNTER — Other Ambulatory Visit: Payer: Self-pay

## 2022-10-31 ENCOUNTER — Telehealth: Payer: 59 | Admitting: Family Medicine

## 2022-10-31 DIAGNOSIS — K649 Unspecified hemorrhoids: Secondary | ICD-10-CM | POA: Diagnosis not present

## 2022-10-31 MED ORDER — HYDROCORTISONE ACETATE 25 MG RE SUPP
25.0000 mg | Freq: Two times a day (BID) | RECTAL | 0 refills | Status: DC
  Filled 2022-10-31: qty 12, 6d supply, fill #0

## 2022-10-31 NOTE — Progress Notes (Signed)

## 2022-11-16 ENCOUNTER — Ambulatory Visit: Payer: Commercial Managed Care - PPO | Admitting: Family

## 2022-11-21 ENCOUNTER — Other Ambulatory Visit: Payer: Self-pay

## 2022-11-22 ENCOUNTER — Ambulatory Visit: Payer: 59 | Admitting: Family

## 2022-11-22 VITALS — BP 106/78 | HR 99 | Resp 18 | Ht 61.0 in | Wt 173.0 lb

## 2022-11-22 DIAGNOSIS — E785 Hyperlipidemia, unspecified: Secondary | ICD-10-CM | POA: Diagnosis not present

## 2022-11-22 DIAGNOSIS — I10 Essential (primary) hypertension: Secondary | ICD-10-CM

## 2022-11-22 DIAGNOSIS — Z23 Encounter for immunization: Secondary | ICD-10-CM

## 2022-11-22 DIAGNOSIS — R739 Hyperglycemia, unspecified: Secondary | ICD-10-CM | POA: Diagnosis not present

## 2022-11-22 LAB — LIPID PANEL
Cholesterol: 144 mg/dL (ref 0–200)
HDL: 42.5 mg/dL (ref >39.00)
NonHDL: 101.25
Total CHOL/HDL Ratio: 3
Triglycerides: 322 mg/dL — ABNORMAL HIGH (ref 0.0–149.0)
VLDL: 64.4 mg/dL — ABNORMAL HIGH (ref 0.0–40.0)

## 2022-11-22 LAB — COMPREHENSIVE METABOLIC PANEL
ALT: 44 U/L — ABNORMAL HIGH (ref 0–35)
AST: 33 U/L (ref 0–37)
Albumin: 3.9 g/dL (ref 3.5–5.2)
Alkaline Phosphatase: 79 U/L (ref 39–117)
BUN: 11 mg/dL (ref 6–23)
CO2: 26 mEq/L (ref 19–32)
Calcium: 8.4 mg/dL (ref 8.4–10.5)
Chloride: 100 mEq/L (ref 96–112)
Creatinine, Ser: 0.58 mg/dL (ref 0.40–1.20)
GFR: 111.95 mL/min (ref >60.00)
Glucose, Bld: 102 mg/dL — ABNORMAL HIGH (ref 70–99)
Potassium: 3.8 mEq/L (ref 3.5–5.1)
Sodium: 137 mEq/L (ref 135–145)
Total Bilirubin: 0.4 mg/dL (ref 0.2–1.2)
Total Protein: 7.2 g/dL (ref 6.0–8.3)

## 2022-11-22 LAB — HEMOGLOBIN A1C: Hgb A1c MFr Bld: 6.4 % (ref 4.6–6.5)

## 2022-11-22 LAB — LDL CHOLESTEROL, DIRECT: Direct LDL: 66 mg/dL

## 2022-11-22 NOTE — Assessment & Plan Note (Signed)
Will check follow up A1C.  Continue metformin.  We discussed improving her diet. She has had increased stress following recent death of her grandfather.

## 2022-11-22 NOTE — Assessment & Plan Note (Signed)
Last lipid at goal, tolerating statin. Obtain follow up lipid panel.

## 2022-11-22 NOTE — Assessment & Plan Note (Signed)
Stable/improved on increased dose of carvedilol. Will continue along with diovan and HCTZ.

## 2022-11-22 NOTE — Progress Notes (Signed)
Subjective:     Patient ID: Alyssa Berry, female    DOB: Feb 15, 1981, 42 y.o.   MRN: [id number]  Chief Complaint  Patient presents with   Follow-up    HPI Patient is in today for follow up of her blood pressure.  Last visit we increased carvedilol from 6.25mg  to 12.5mg  bid.   BP Readings from Last 3 Encounters:  11/22/22 106/78  06/04/22 (!) 145/100  04/30/22 (!) 131/96   We also added hctz and discontinued amlodipine.  She notes resolution of muscle pain that she was having on amlodipine. She is feeling well on this regimen and home bp readings are stable.   Lab Results  Component Value Date   HGBA1C 6.3 04/30/2022     Lab Results  Component Value Date   CHOL 152 08/25/2021   HDL 44.60 08/25/2021   LDLCALC 89 08/25/2021   TRIG 92.0 08/25/2021   CHOLHDL 3 08/25/2021    Health Maintenance Due  Topic Date Due   COVID-19 Vaccine (4 - 2023-24 season) 05/04/2022    Past Medical History:  Diagnosis Date   Arthritis    Borderline diabetes    Hypertension    Migraine    Primary biliary cirrhosis (HCC)    PVC (premature ventricular contraction)     Past Surgical History:  Procedure Laterality Date   TUBAL LIGATION  2008    Family History  Problem Relation Age of Onset   Hyperlipidemia Mother    Hypertension Mother    Cancer Maternal Grandmother        lung   Hyperlipidemia Maternal Grandmother    Hypertension Maternal Grandmother    Hyperlipidemia Maternal Grandfather    Depression Daughter    Hypertension Sister    Hypertension Brother    Colon cancer Neg Hx     Social History   Socioeconomic History   Marital status: Married    Spouse name: Not on file   Number of children: 3   Years of education: Not on file   Highest education level: Not on file  Occupational History   Occupation: CMA  Tobacco Use   Smoking status: Never   Smokeless tobacco: Never  Vaping Use   Vaping Use: Never used  Substance and Sexual Activity   Alcohol use:  No    Alcohol/week: 0.0 standard drinks of alcohol   Drug use: No   Sexual activity: Not on file    Comment: tubal ligation  Other Topics Concern   Not on file  Social History Narrative   Married   3 Children   2002- daughter Theadore Nan   2004- Cathryn   2008Lorayne Marek   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 90 tablet 1   carvedilol (COREG) 12.5 MG tablet Take 1 tablet (12.5 mg total) by mouth 2 (two) times daily with a meal. 180 tablet 1   hydrochlorothiazide (HYDRODIURIL) 25 MG tablet Take 1 tablet (25 mg total) by mouth daily. 90 tablet 1  hydrocortisone (ANUSOL-HC) 25 MG suppository Place 1 suppository (25 mg total) rectally 2 (two) times daily. 12 suppository 0   LO LOESTRIN FE 1 MG-10 MCG / 10 MCG tablet Take 1 tablet by mouth daily. 84 tablet 5   LO LOESTRIN FE 1 MG-10 MCG / 10 MCG tablet Take 1 tablet by mouth daily. 84 tablet 5   metFORMIN (GLUCOPHAGE) 500 MG tablet Take 1 tablet (500 mg total) by mouth 2 (two) times daily with a meal. 180 tablet 1   Multiple Vitamins-Minerals (MULTIVITAL) tablet Take 1 tablet by mouth daily. WITH POTASSIUM     valsartan (DIOVAN) 320 MG tablet Take 1 tablet (320 mg total) by mouth daily. 90 tablet 1   No facility-administered medications prior to visit.    No Known Allergies  ROS    See HPI Objective:    Physical Exam Constitutional:      General: She is not in acute distress.    Appearance: Normal appearance. She is well-developed.  HENT:     Head:  Normocephalic and atraumatic.     Right Ear: External ear normal.     Left Ear: External ear normal.  Eyes:     General: No scleral icterus. Neck:     Thyroid: No thyromegaly.  Cardiovascular:     Rate and Rhythm: Normal rate and regular rhythm.     Heart sounds: Normal heart sounds. No murmur heard. Pulmonary:     Effort: Pulmonary effort is normal. No respiratory distress.     Breath sounds: Normal breath sounds. No wheezing.  Musculoskeletal:     Cervical back: Neck supple.  Skin:    General: Skin is warm and dry.  Neurological:     Mental Status: She is alert and oriented to person, place, and time.  Psychiatric:        Mood and Affect: Mood normal.        Behavior: Behavior normal.        Thought Content: Thought content normal.        Judgment: Judgment normal.     BP 106/78   Pulse 99   Resp 18   Ht 5\' 1"  (1.549 m)   Wt 173 lb (78.5 kg)   SpO2 100%   BMI 32.69 kg/m  Wt Readings from Last 3 Encounters:  11/22/22 173 lb (78.5 kg)  06/04/22 168 lb 12.8 oz (76.6 kg)  04/30/22 169 lb (76.7 kg)       Assessment & Plan:   Problem List Items Addressed This Visit       Unprioritized   Primary hypertension    Stable/improved on increased dose of carvedilol. Will continue along with diovan and HCTZ.        Hyperlipidemia - Primary    Last lipid at goal, tolerating statin. Obtain follow up lipid panel.       Relevant Orders   Lipid panel   Comp Met (CMET)   Hyperglycemia    Will check follow up A1C.  Continue metformin.  We discussed improving her diet. She has had increased stress following recent death of her grandfather.       Relevant Orders   HgB A1c   Other Visit Diagnoses     Need for Tdap vaccination       Relevant Orders   Tdap vaccine greater than or equal to 7yo IM (Completed)      Patient states she completed Hep B series at work.   I am having Alyssa Berry "Alyssa Berry" maintain  her Multivital, Lo Loestrin Fe, atorvastatin,  valsartan, carvedilol, metFORMIN, hydrochlorothiazide, Lo Loestrin Fe, and hydrocortisone.  No orders of the defined types were placed in this encounter.

## 2022-11-27 ENCOUNTER — Telehealth: Payer: 59 | Admitting: Nurse Practitioner

## 2022-11-27 ENCOUNTER — Other Ambulatory Visit: Payer: Self-pay

## 2022-11-27 DIAGNOSIS — J40 Bronchitis, not specified as acute or chronic: Secondary | ICD-10-CM

## 2022-11-27 MED ORDER — DOXYCYCLINE HYCLATE 100 MG PO TABS
100.0000 mg | ORAL_TABLET | Freq: Two times a day (BID) | ORAL | 0 refills | Status: AC
  Filled 2022-11-27: qty 14, 7d supply, fill #0

## 2022-11-27 MED ORDER — ALBUTEROL SULFATE HFA 108 (90 BASE) MCG/ACT IN AERS
2.0000 | INHALATION_SPRAY | Freq: Four times a day (QID) | RESPIRATORY_TRACT | 0 refills | Status: DC | PRN
  Filled 2022-11-27: qty 6.7, 25d supply, fill #0

## 2022-11-27 NOTE — Progress Notes (Signed)
E-Visit for Cough  We are sorry that you are not feeling well.  Here is how we plan to help!  Based on your presentation I believe you most likely have A cough due to bacteria.  When patients have a fever and a productive cough with a change in color or increased sputum production, we are concerned about bacterial bronchitis.  If left untreated it can progress to pneumonia.  If your symptoms do not improve with your treatment plan it is important that you contact your provider.   I have prescribed Doxycycline 100 mg twice a day for 7 days     In addition you may use A non-prescription cough medication called Mucinex DM: take 2 tablets every 12 hours.  We will also prescribe an inhaler to help with your cough   Meds ordered this encounter  Medications   doxycycline (VIBRA-TABS) 100 MG tablet    Sig: Take 1 tablet (100 mg total) by mouth 2 (two) times daily for 7 days.    Dispense:  14 tablet    Refill:  0   albuterol (VENTOLIN HFA) 108 (90 Base) MCG/ACT inhaler    Sig: Inhale 2 puffs into the lungs every 6 (six) hours as needed for wheezing or shortness of breath.    Dispense:  8 g    Refill:  0     From your responses in the eVisit questionnaire you describe inflammation in the upper respiratory tract which is causing a significant cough.  This is commonly called Bronchitis and has four common causes:   Allergies Viral Infections Acid Reflux Bacterial Infection Allergies, viruses and acid reflux are treated by controlling symptoms or eliminating the cause. An example might be a cough caused by taking certain blood pressure medications. You stop the cough by changing the medication. Another example might be a cough caused by acid reflux. Controlling the reflux helps control the cough.  USE OF BRONCHODILATOR ("RESCUE") INHALERS: There is a risk from using your bronchodilator too frequently.  The risk is that over-reliance on a medication which only relaxes the muscles surrounding the  breathing tubes can reduce the effectiveness of medications prescribed to reduce swelling and congestion of the tubes themselves.  Although you feel brief relief from the bronchodilator inhaler, your asthma may actually be worsening with the tubes becoming more swollen and filled with mucus.  This can delay other crucial treatments, such as oral steroid medications. If you need to use a bronchodilator inhaler daily, several times per day, you should discuss this with your provider.  There are probably better treatments that could be used to keep your asthma under control.     HOME CARE Only take medications as instructed by your medical team. Complete the entire course of an antibiotic. Drink plenty of fluids and get plenty of rest. Avoid close contacts especially the very young and the elderly Cover your mouth if you cough or cough into your sleeve. Always remember to wash your hands A steam or ultrasonic humidifier can help congestion.   GET HELP RIGHT AWAY IF: You develop worsening fever. You become short of breath You cough up blood. Your symptoms persist after you have completed your treatment plan MAKE SURE YOU  Understand these instructions. Will watch your condition. Will get help right away if you are not doing well or get worse.    Thank you for choosing an e-visit.  Your e-visit answers were reviewed by a board certified advanced clinical practitioner to complete your personal care  plan. Depending upon the condition, your plan could have included both over the counter or prescription medications.  Please review your pharmacy choice. Make sure the pharmacy is open so you can pick up prescription now. If there is a problem, you may contact your provider through [employer] and have the prescription routed to another pharmacy.  Your safety is important to [place]. If you have drug allergies check your prescription carefully.   For the next 24 hours you can use MyChart to ask  questions about today's visit, request a non-urgent call back, or ask for a work or school excuse. You will get an email in the next two days asking about your experience. I hope that your e-visit has been valuable and will speed your recovery.   I spent approximately 5 minutes reviewing the patient's history, current symptoms and coordinating their care today.

## 2022-12-25 ENCOUNTER — Encounter: Payer: 59 | Admitting: Family

## 2022-12-28 ENCOUNTER — Other Ambulatory Visit: Payer: Self-pay

## 2023-01-07 ENCOUNTER — Encounter: Payer: 59 | Admitting: Family

## 2023-01-10 ENCOUNTER — Other Ambulatory Visit (HOSPITAL_COMMUNITY): Payer: Self-pay

## 2023-01-10 ENCOUNTER — Other Ambulatory Visit: Payer: Self-pay

## 2023-01-11 ENCOUNTER — Encounter: Payer: 59 | Admitting: Family

## 2023-01-17 ENCOUNTER — Other Ambulatory Visit: Payer: Self-pay | Admitting: Obstetrics and Gynecology

## 2023-01-17 DIAGNOSIS — Z1231 Encounter for screening mammogram for malignant neoplasm of breast: Secondary | ICD-10-CM

## 2023-02-14 ENCOUNTER — Other Ambulatory Visit (HOSPITAL_COMMUNITY): Payer: Self-pay

## 2023-02-15 DIAGNOSIS — Z1231 Encounter for screening mammogram for malignant neoplasm of breast: Secondary | ICD-10-CM

## 2023-02-22 ENCOUNTER — Ambulatory Visit (INDEPENDENT_AMBULATORY_CARE_PROVIDER_SITE_OTHER): Payer: 59 | Admitting: Family

## 2023-02-22 ENCOUNTER — Ambulatory Visit
Admission: RE | Admit: 2023-02-22 | Discharge: 2023-02-22 | Disposition: A | Discharge status: Home / Self Care | Payer: 59 | Source: Ambulatory Visit | Attending: Obstetrics and Gynecology | Admitting: Obstetrics and Gynecology

## 2023-02-22 ENCOUNTER — Encounter: Payer: Self-pay | Admitting: Family

## 2023-02-22 ENCOUNTER — Other Ambulatory Visit (HOSPITAL_COMMUNITY): Payer: Self-pay

## 2023-02-22 VITALS — BP 108/75 | HR 93 | Temp 98.7°F | Resp 16 | Ht 61.0 in | Wt 170.0 lb

## 2023-02-22 DIAGNOSIS — Z0000 Encounter for general adult medical examination without abnormal findings: Secondary | ICD-10-CM

## 2023-02-22 DIAGNOSIS — I10 Essential (primary) hypertension: Secondary | ICD-10-CM

## 2023-02-22 DIAGNOSIS — R7303 Prediabetes: Secondary | ICD-10-CM | POA: Diagnosis not present

## 2023-02-22 DIAGNOSIS — G43809 Other migraine, not intractable, without status migrainosus: Secondary | ICD-10-CM

## 2023-02-22 DIAGNOSIS — Z1231 Encounter for screening mammogram for malignant neoplasm of breast: Secondary | ICD-10-CM | POA: Diagnosis not present

## 2023-02-22 DIAGNOSIS — E785 Hyperlipidemia, unspecified: Secondary | ICD-10-CM | POA: Diagnosis not present

## 2023-02-22 MED ORDER — NURTEC 75 MG PO TBDP
1.0000 | ORAL_TABLET | Freq: Every day | ORAL | 5 refills | Status: DC | PRN
  Filled 2023-02-22: qty 8, 30d supply, fill #0

## 2023-02-22 NOTE — Assessment & Plan Note (Signed)
Lab Results  Component Value Date   HGBA1C 6.4 11/22/2022   HGBA1C 6.3 04/30/2022   HGBA1C 6.3 12/29/2021   Lab Results  Component Value Date   LDLCALC 89 08/25/2021   CREATININE 0.58 11/22/2022   Stable on metformin, continue same.

## 2023-02-22 NOTE — Progress Notes (Addendum)
Subjective:     Patient ID: Alyssa Berry, female    DOB: 23-Oct-1980, 42 y.o.   MRN: [id number]  Chief Complaint  Patient presents with   Annual Exam         HPI  Discussed the use of AI scribe software for clinical note transcription with the patient, who gave verbal consent to proceed.  History of Present Illness   The patient, with a history of migraines and high cholesterol, presents for a physical. They report eating poorly and experiencing migraines approximately two to three times a month, primarily triggered by weather changes. Current treatment with Excedrin is not fully effective, often requiring multiple doses and supplemented with sleep and caffeine. They have previously tried Imitrex without success.  The patient also has a history of high cholesterol, with a particular issue with elevated triglycerides. They admit to recent poor dietary choices.  In addition, the patient has been more active recently due to acquiring a new dog, which has led to increased walking. They also mention being an emotional eater, particularly since the passing of their grandfather. Despite this, they have lost three pounds since their last visit in March.  The patient is currently on carvedilol, hydrochlorothiazide, and valsartan for blood pressure management, and metformin for diabetes, with their last A1c at 6.4. They also report no current concerns about depression or anxiety.          Health Maintenance Due  Topic Date Due   COVID-19 Vaccine (4 - 2023-24 season) 05/04/2022    Past Medical History:  Diagnosis Date   Arthritis    Borderline diabetes    Hypertension    Migraine    Primary biliary cirrhosis (HCC)    PVC (premature ventricular contraction)     Past Surgical History:  Procedure Laterality Date   TUBAL LIGATION  2008    Family History  Problem Relation Age of Onset   Hyperlipidemia Mother    Hypertension Mother    Hypertension Sister    Hypertension  Brother    Cancer Maternal Grandmother        lung   Hyperlipidemia Maternal Grandmother    Hypertension Maternal Grandmother    Hypertension Maternal Grandfather        died from sepsis   Depression Daughter    Colon cancer Neg Hx     Social History   Socioeconomic History   Marital status: Married    Spouse name: Not on file   Number of children: 3   Years of education: Not on file   Highest education level: Not on file  Occupational History   Occupation: CMA  Tobacco Use   Smoking status: Never   Smokeless tobacco: Never  Vaping Use   Vaping Use: Never used  Substance and Sexual Activity   Alcohol use: No    Alcohol/week: 0.0 standard drinks of alcohol   Drug use: No   Sexual activity: Not on file    Comment: tubal ligation  Other Topics Concern   Not on file  Social History Narrative   Married   3 Children   2002- daughter Otho Ket   2004- Cathryn   2008Alphonzo Lemmings   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 90 tablet 1   carvedilol (COREG) 12.5 MG tablet Take 1 tablet (12.5 mg total) by mouth 2 (two) times daily with a meal. 180 tablet 1   hydrochlorothiazide (HYDRODIURIL) 25 MG tablet Take 1 tablet (25 mg total) by mouth daily. 90 tablet 1   LO LOESTRIN FE 1 MG-10 MCG / 10 MCG tablet Take 1 tablet by mouth daily. 84 tablet 5   metFORMIN (GLUCOPHAGE) 500 MG tablet Take 1 tablet (500 mg total) by mouth 2 (two) times daily with a meal. 180 tablet 1   Multiple Vitamins-Minerals (MULTIVITAL)  tablet Take 1 tablet by mouth daily. WITH POTASSIUM     valsartan (DIOVAN) 320 MG tablet Take 1 tablet (320 mg total) by mouth daily. 90 tablet 1   LO LOESTRIN FE 1 MG-10 MCG / 10 MCG tablet Take 1 tablet by mouth daily. 84 tablet 5   albuterol (VENTOLIN HFA) 108 (90 Base) MCG/ACT inhaler Inhale 2 puffs into the lungs every 6 (six) hours as needed for wheezing or shortness of breath. 6.7 g 0   hydrocortisone (ANUSOL-HC) 25 MG suppository Place 1 suppository (25 mg total) rectally 2 (two) times daily. 12 suppository 0   No facility-administered medications prior to visit.    No Known Allergies  Review of Systems  Constitutional:  Negative for weight loss.  HENT:  Negative for congestion and hearing loss.   Eyes:  Negative for blurred vision.  Respiratory:  Negative for cough.   Cardiovascular:  Negative for leg swelling.  Gastrointestinal:  Negative for constipation and diarrhea.  Genitourinary:  Negative for dysuria and frequency.  Musculoskeletal:  Negative for joint pain and myalgias.  Skin:  Negative for rash.  Neurological:  Positive for headaches.  Psychiatric/Behavioral:  Negative for depression.        Objective:    Physical Exam   BP 108/75 (BP Location: Right Arm, Patient Position: Sitting, Cuff Size: Large)   Pulse 93   Temp 98.7 F (37.1 C) (Oral)   Resp 16   Ht 5\' 1"  (1.549 m)   Wt 170 lb (77.1 kg)   SpO2 99%   BMI 32.12 kg/m  Wt Readings from Last 3 Encounters:  02/22/23 170 lb (77.1 kg)  11/22/22 173 lb (78.5 kg)  06/04/22 168 lb 12.8 oz (76.6 kg)   Physical Exam  Constitutional: She is oriented to person, place, and time. She appears well-developed and well-nourished. No distress.  HENT:  Head: Normocephalic and atraumatic.  Right Ear: Tympanic membrane and ear canal normal.  Left Ear: Tympanic membrane and ear canal normal.  Mouth/Throat: Oropharynx is clear and moist.  Eyes: Pupils are equal, round, and reactive to light. No scleral icterus.   Neck: Normal range of motion. No thyromegaly present.  Cardiovascular: Normal rate and regular rhythm.   No murmur heard. Pulmonary/Chest: Effort normal and breath sounds normal. No respiratory distress. He has no wheezes. She has no rales. She exhibits no tenderness.  Abdominal: Soft. Bowel sounds are normal. She exhibits no distension and no mass. There is no tenderness. There is no rebound and no guarding.  Musculoskeletal: She exhibits no edema.  Lymphadenopathy:    She has no cervical adenopathy.  Neurological: She is alert and oriented to person, place, and time.  She has normal patellar reflexes. She exhibits normal muscle tone. Coordination normal.  Skin: Skin is warm and dry.  Dry lesions noted on feet/legs from mosquito bites that she has picked at Psychiatric: She has a normal mood and affect. Her behavior is normal. Judgment and thought content normal.  Breast/Pelvic: deferred         Assessment & Plan:       Assessment & Plan:   Problem List Items Addressed This Visit       Unprioritized   Primary hypertension    BP Readings from Last 3 Encounters:  02/22/23 108/75  11/22/22 106/78  06/04/22 (!) 145/100  On valsartan, hydrochlorothiazide, carvedilol. Stable.       Preventative health care - Primary    Discussed healthy diet, exercise and weight loss.  Encouraged her to get her covid and flu shots in the fall.          Migraines    Uncontrolled. Failed Triptan. Trial of nurtec.       Relevant Medications   Rimegepant Sulfate (NURTEC) 75 MG TBDP   Hyperlipidemia    Lab Results  Component Value Date   CHOL 144 11/22/2022   HDL 42.50 11/22/2022   LDLCALC 89 08/25/2021   LDLDIRECT 66.0 11/22/2022   TRIG 322.0 (H) 11/22/2022   CHOLHDL 3 11/22/2022  Continue diet, check trigs.        Relevant Orders   Lipid panel   Borderline diabetes    Lab Results  Component Value Date   HGBA1C 6.4 11/22/2022   HGBA1C 6.3 04/30/2022   HGBA1C 6.3 12/29/2021    Lab Results  Component Value Date   LDLCALC 89 08/25/2021   CREATININE 0.58 11/22/2022  Stable on metformin, continue same.      Relevant Orders   HgB A1c   Comp Met (CMET)    I have discontinued Cassondra Degraff "Chan"'s hydrocortisone and albuterol. I am also having her start on Nurtec. Additionally, I am having her maintain her Multivital, Lo Loestrin Fe, atorvastatin, valsartan, carvedilol, metFORMIN, and hydrochlorothiazide.  Meds ordered this encounter  Medications   Rimegepant Sulfate (NURTEC) 75 MG TBDP    Sig: Take 1 tablet (75 mg total) by mouth daily as needed (Migraine).    Dispense:  8 tablet    Refill:  5    Order Specific Question:   Supervising Provider    Answer:   Danise Edge A [[id number]]

## 2023-02-22 NOTE — Assessment & Plan Note (Signed)
BP Readings from Last 3 Encounters:  02/22/23 108/75  11/22/22 106/78  06/04/22 (!) 145/100   On valsartan, hydrochlorothiazide, carvedilol. Stable.

## 2023-02-22 NOTE — Assessment & Plan Note (Signed)
Lab Results  Component Value Date   CHOL 144 11/22/2022   HDL 42.50 11/22/2022   LDLCALC 89 08/25/2021   LDLDIRECT 66.0 11/22/2022   TRIG 322.0 (H) 11/22/2022   CHOLHDL 3 11/22/2022   Continue diet, check trigs.

## 2023-02-22 NOTE — Assessment & Plan Note (Signed)
Discussed healthy diet, exercise and weight loss.  Encouraged her to get her covid and flu shots in the fall.

## 2023-02-22 NOTE — Assessment & Plan Note (Addendum)
Uncontrolled. Failed Triptan. Trial of nurtec.

## 2023-02-22 NOTE — Patient Instructions (Signed)
During your visit, we discussed your migraines, high cholesterol, blood pressure, and diabetes. You mentioned that your migraines are not fully controlled with Excedrin and that you have been eating poorly, which may be affecting your cholesterol levels. Your blood pressure is well controlled with your current medications, and your diabetes is being managed with Metformin.  YOUR PLAN:  -MIGRAINES: Your migraines are not fully controlled with your current medication. We will submit a request for a new medication, Nurtec, which may help manage your migraines better.  -HIGH CHOLESTEROL: Your cholesterol levels, particularly your triglycerides, are high. This can be due to poor dietary habits. We will order a lipid panel to assess your current cholesterol levels.  -BLOOD PRESSURE: Your blood pressure is well controlled with your current medications, Carvedilol and Hydrochlorothiazide. Continue taking these medications as prescribed.  -DIABETES: Your diabetes is being managed with Metformin. We will order an A1c and kidney function tests to monitor your diabetes and kidney health.  -GENERAL HEALTH: Continue regular exercise, improve your diet, and keep up with your regular vision and dental check-ups. We will update your flu shot and COVID booster in the fall. Check back in six months for a follow-up.

## 2023-02-23 ENCOUNTER — Telehealth: Payer: Self-pay | Admitting: Family

## 2023-02-23 DIAGNOSIS — R7989 Other specified abnormal findings of blood chemistry: Secondary | ICD-10-CM

## 2023-02-23 LAB — LIPID PANEL
Cholesterol: 145 mg/dL (ref <200)
HDL: 36 mg/dL — ABNORMAL LOW (ref >=50)
Non-HDL Cholesterol (Calc): 109 mg/dL (calc) (ref <130)
Total CHOL/HDL Ratio: 4 (calc) (ref <5.0)
Triglycerides: 407 mg/dL — ABNORMAL HIGH (ref <150)

## 2023-02-23 LAB — COMPREHENSIVE METABOLIC PANEL
AG Ratio: 1.3 (calc) (ref 1.0–2.5)
ALT: 39 U/L — ABNORMAL HIGH (ref 6–29)
AST: 34 U/L — ABNORMAL HIGH (ref 10–30)
Albumin: 4 g/dL (ref 3.6–5.1)
Alkaline phosphatase (APISO): 71 U/L (ref 31–125)
BUN: 11 mg/dL (ref 7–25)
CO2: 25 mmol/L (ref 20–32)
Calcium: 8.9 mg/dL (ref 8.6–10.2)
Chloride: 102 mmol/L (ref 98–110)
Creat: 0.71 mg/dL (ref 0.50–0.99)
Globulin: 3.2 g/dL (calc) (ref 1.9–3.7)
Glucose, Bld: 76 mg/dL (ref 65–99)
Potassium: 3.6 mmol/L (ref 3.5–5.3)
Sodium: 138 mmol/L (ref 135–146)
Total Bilirubin: 0.4 mg/dL (ref 0.2–1.2)
Total Protein: 7.2 g/dL (ref 6.1–8.1)

## 2023-02-23 LAB — HEMOGLOBIN A1C
Hgb A1c MFr Bld: 6.4 % of total Hgb — ABNORMAL HIGH (ref <5.7)
Mean Plasma Glucose: 137 mg/dL
eAG (mmol/L): 7.6 mmol/L

## 2023-02-23 NOTE — Telephone Encounter (Signed)
Please advise pt that her liver function tests are elevated.  I suspect that this is due to fatty liver.  I would like for her ot complete an ultrasound to confirm.   Also, triglycerides are quite high.  Please continue work on limiting white carbs/sugar in diet, exercise and weight loss.   A1C is stable at 6.4.

## 2023-02-25 NOTE — Telephone Encounter (Signed)
Patient advised of results, the need for abd [place] and provider's comments.

## 2023-03-02 ENCOUNTER — Telehealth: Payer: 59 | Admitting: Family Medicine

## 2023-03-02 DIAGNOSIS — W19XXXA Unspecified fall, initial encounter: Secondary | ICD-10-CM | POA: Diagnosis not present

## 2023-03-02 DIAGNOSIS — M545 Low back pain, unspecified: Secondary | ICD-10-CM

## 2023-03-03 MED ORDER — NAPROXEN 500 MG PO TABS: 500.0000 mg | ORAL_TABLET | Freq: Two times a day (BID) | ORAL | 0 refills | Status: DC

## 2023-03-03 MED ORDER — CYCLOBENZAPRINE HCL 10 MG PO TABS: 10.0000 mg | ORAL_TABLET | Freq: Three times a day (TID) | ORAL | 0 refills | Status: DC | PRN

## 2023-03-03 NOTE — Progress Notes (Signed)

## 2023-03-08 ENCOUNTER — Ambulatory Visit (HOSPITAL_BASED_OUTPATIENT_CLINIC_OR_DEPARTMENT_OTHER)
Admission: RE | Admit: 2023-03-08 | Discharge: 2023-03-08 | Disposition: A | Discharge status: Home / Self Care | Payer: 59 | Source: Ambulatory Visit | Attending: Family | Admitting: Family

## 2023-03-08 ENCOUNTER — Encounter: Payer: Self-pay | Admitting: Family

## 2023-03-08 DIAGNOSIS — K760 Fatty (change of) liver, not elsewhere classified: Secondary | ICD-10-CM | POA: Insufficient documentation

## 2023-03-08 DIAGNOSIS — R7989 Other specified abnormal findings of blood chemistry: Secondary | ICD-10-CM

## 2023-03-18 ENCOUNTER — Other Ambulatory Visit: Payer: Self-pay | Admitting: Family

## 2023-03-18 ENCOUNTER — Other Ambulatory Visit (HOSPITAL_COMMUNITY): Payer: Self-pay

## 2023-03-18 MED ORDER — VALSARTAN 320 MG PO TABS
320.0000 mg | ORAL_TABLET | Freq: Every day | ORAL | 1 refills | Status: DC
  Filled 2023-03-18: qty 30, 30d supply, fill #0
  Filled 2023-04-13: qty 30, 30d supply, fill #1
  Filled 2023-05-15: qty 30, 30d supply, fill #2
  Filled 2023-06-13: qty 30, 30d supply, fill #3
  Filled 2023-07-15: qty 60, 60d supply, fill #4

## 2023-03-18 MED ORDER — ATORVASTATIN CALCIUM 40 MG PO TABS
40.0000 mg | ORAL_TABLET | Freq: Every day | ORAL | 1 refills | Status: DC
  Filled 2023-03-18: qty 90, 90d supply, fill #0
  Filled 2023-06-13: qty 90, 90d supply, fill #1

## 2023-03-19 ENCOUNTER — Other Ambulatory Visit (HOSPITAL_COMMUNITY): Payer: Self-pay

## 2023-03-19 ENCOUNTER — Other Ambulatory Visit: Payer: Self-pay

## 2023-04-02 ENCOUNTER — Other Ambulatory Visit (HOSPITAL_COMMUNITY): Payer: Self-pay

## 2023-04-04 ENCOUNTER — Other Ambulatory Visit (HOSPITAL_COMMUNITY): Payer: Self-pay

## 2023-04-05 ENCOUNTER — Other Ambulatory Visit (HOSPITAL_COMMUNITY): Payer: Self-pay

## 2023-04-09 ENCOUNTER — Other Ambulatory Visit (HOSPITAL_COMMUNITY): Payer: Self-pay

## 2023-04-09 ENCOUNTER — Other Ambulatory Visit: Payer: Self-pay | Admitting: Family

## 2023-04-10 ENCOUNTER — Other Ambulatory Visit (HOSPITAL_COMMUNITY): Payer: Self-pay

## 2023-04-10 ENCOUNTER — Other Ambulatory Visit: Payer: Self-pay

## 2023-04-10 MED ORDER — HYDROCHLOROTHIAZIDE 25 MG PO TABS
25.0000 mg | ORAL_TABLET | Freq: Every day | ORAL | 1 refills | Status: DC
  Filled 2023-04-10: qty 90, 90d supply, fill #0
  Filled 2023-07-15: qty 90, 90d supply, fill #1

## 2023-04-10 MED ORDER — CARVEDILOL 12.5 MG PO TABS
12.5000 mg | ORAL_TABLET | Freq: Two times a day (BID) | ORAL | 1 refills | Status: DC
  Filled 2023-04-10: qty 180, 90d supply, fill #0
  Filled 2023-07-15: qty 180, 90d supply, fill #1

## 2023-04-10 MED ORDER — LO LOESTRIN FE 1 MG-10 MCG / 10 MCG PO TABS
1.0000 | ORAL_TABLET | Freq: Every day | ORAL | 4 refills | Status: DC
  Filled 2023-04-10 – 2023-05-15 (×2): qty 84, 84d supply, fill #0
  Filled 2023-08-02: qty 84, 84d supply, fill #1
  Filled 2023-10-25: qty 84, 84d supply, fill #2

## 2023-04-13 ENCOUNTER — Other Ambulatory Visit: Payer: Self-pay | Admitting: Family

## 2023-04-13 ENCOUNTER — Other Ambulatory Visit (HOSPITAL_COMMUNITY): Payer: Self-pay

## 2023-04-14 ENCOUNTER — Other Ambulatory Visit (HOSPITAL_COMMUNITY): Payer: Self-pay

## 2023-04-14 MED ORDER — METFORMIN HCL 500 MG PO TABS
500.0000 mg | ORAL_TABLET | Freq: Two times a day (BID) | ORAL | 1 refills | Status: DC
  Filled 2023-04-14: qty 180, 90d supply, fill #0
  Filled 2023-07-15: qty 180, 90d supply, fill #1

## 2023-04-15 ENCOUNTER — Other Ambulatory Visit (HOSPITAL_COMMUNITY): Payer: Self-pay

## 2023-04-18 ENCOUNTER — Other Ambulatory Visit (HOSPITAL_COMMUNITY): Payer: Self-pay

## 2023-05-15 ENCOUNTER — Other Ambulatory Visit: Payer: Self-pay

## 2023-05-15 ENCOUNTER — Telehealth: Payer: 59 | Admitting: Emergency Medicine

## 2023-05-15 DIAGNOSIS — J329 Chronic sinusitis, unspecified: Secondary | ICD-10-CM | POA: Diagnosis not present

## 2023-05-15 MED ORDER — AMOXICILLIN-POT CLAVULANATE 875-125 MG PO TABS
1.0000 | ORAL_TABLET | Freq: Two times a day (BID) | ORAL | 0 refills | Status: AC
Start: 2023-05-15 — End: 2023-05-22
  Filled 2023-05-15: qty 14, 7d supply, fill #0

## 2023-05-15 NOTE — Progress Notes (Signed)
E-Visit for Sinus Problems  We are sorry that you are not feeling well.  Here is how we plan to help!  Based on what you have shared with me it looks like you have sinusitis.  Sinusitis is inflammation and infection in the sinus cavities of the head.  Based on your presentation I believe you most likely have Acute Bacterial Sinusitis.  This is an infection caused by bacteria and is treated with antibiotics. I have prescribed Augmentin 875mg/125mg one tablet twice daily with food, for 7 days. You may use an oral decongestant such as Mucinex D or if you have glaucoma or high blood pressure use plain Mucinex. Saline nasal spray help and can safely be used as often as needed for congestion.  If you develop worsening sinus pain, fever or notice severe headache and vision changes, or if symptoms are not better after completion of antibiotic, please schedule an appointment with a health care provider.    Sinus infections are not as easily transmitted as other respiratory infection, however we still recommend that you avoid close contact with loved ones, especially the very young and elderly.  Remember to wash your hands thoroughly throughout the day as this is the number one way to prevent the spread of infection!  Home Care: Only take medications as instructed by your medical team. Complete the entire course of an antibiotic. Do not take these medications with alcohol. A steam or ultrasonic humidifier can help congestion.  You can place a towel over your head and breathe in the steam from hot water coming from a faucet. Avoid close contacts especially the very young and the elderly. Cover your mouth when you cough or sneeze. Always remember to wash your hands.  Get Help Right Away If: You develop worsening fever or sinus pain. You develop a severe head ache or visual changes. Your symptoms persist after you have completed your treatment plan.  Make sure you Understand these instructions. Will watch  your condition. Will get help right away if you are not doing well or get worse.  Thank you for choosing an e-visit.  Your e-visit answers were reviewed by a board certified advanced clinical practitioner to complete your personal care plan. Depending upon the condition, your plan could have included both over the counter or prescription medications.  Please review your pharmacy choice. Make sure the pharmacy is open so you can pick up prescription now. If there is a problem, you may contact your provider through MyChart messaging and have the prescription routed to another pharmacy.  Your safety is important to us. If you have drug allergies check your prescription carefully.   For the next 24 hours you can use MyChart to ask questions about today's visit, request a non-urgent call back, or ask for a work or school excuse. You will get an email in the next two days asking about your experience. I hope that your e-visit has been valuable and will speed your recovery.  Approximately 5 minutes was used in reviewing the patient's chart, questionnaire, prescribing medications, and documentation.  

## 2023-06-13 ENCOUNTER — Other Ambulatory Visit (HOSPITAL_COMMUNITY): Payer: Self-pay

## 2023-06-21 ENCOUNTER — Ambulatory Visit: Payer: 59 | Admitting: Gastroenterology

## 2023-06-21 ENCOUNTER — Other Ambulatory Visit (INDEPENDENT_AMBULATORY_CARE_PROVIDER_SITE_OTHER): Payer: 59

## 2023-06-21 ENCOUNTER — Encounter: Payer: Self-pay | Admitting: Gastroenterology

## 2023-06-21 VITALS — BP 124/90 | HR 86 | Ht 61.0 in | Wt 164.5 lb

## 2023-06-21 DIAGNOSIS — R7989 Other specified abnormal findings of blood chemistry: Secondary | ICD-10-CM | POA: Diagnosis not present

## 2023-06-21 DIAGNOSIS — R932 Abnormal findings on diagnostic imaging of liver and biliary tract: Secondary | ICD-10-CM | POA: Diagnosis not present

## 2023-06-21 DIAGNOSIS — K743 Primary biliary cirrhosis: Secondary | ICD-10-CM | POA: Diagnosis not present

## 2023-06-21 LAB — PROTIME-INR
INR: 1.1 {ratio} — ABNORMAL HIGH (ref 0.8–1.0)
Prothrombin Time: 11.3 s (ref 9.6–13.1)

## 2023-06-21 LAB — COMPREHENSIVE METABOLIC PANEL
ALT: 64 U/L — ABNORMAL HIGH (ref 0–35)
AST: 65 U/L — ABNORMAL HIGH (ref 0–37)
Albumin: 4.2 g/dL (ref 3.5–5.2)
Alkaline Phosphatase: 71 U/L (ref 39–117)
BUN: 13 mg/dL (ref 6–23)
CO2: 26 meq/L (ref 19–32)
Calcium: 9.3 mg/dL (ref 8.4–10.5)
Chloride: 98 meq/L (ref 96–112)
Creatinine, Ser: 0.58 mg/dL (ref 0.40–1.20)
GFR: 111.5 mL/min (ref >60.00)
Glucose, Bld: 97 mg/dL (ref 70–99)
Potassium: 3.2 meq/L — ABNORMAL LOW (ref 3.5–5.1)
Sodium: 138 meq/L (ref 135–145)
Total Bilirubin: 0.3 mg/dL (ref 0.2–1.2)
Total Protein: 8 g/dL (ref 6.0–8.3)

## 2023-06-21 LAB — CBC
HCT: 38.9 % (ref 36.0–46.0)
Hemoglobin: 12.7 g/dL (ref 12.0–15.0)
MCHC: 32.7 g/dL (ref 30.0–36.0)
MCV: 90.5 fL (ref 78.0–100.0)
Platelets: 365 10*3/uL (ref 150.0–400.0)
RBC: 4.3 Mil/uL (ref 3.87–5.11)
RDW: 13.2 % (ref 11.5–15.5)
WBC: 8.1 10*3/uL (ref 4.0–10.5)

## 2023-06-21 LAB — GAMMA GT: GGT: 139 U/L — ABNORMAL HIGH (ref 7–51)

## 2023-06-21 NOTE — Patient Instructions (Signed)
Your provider has requested that you go to the basement level for lab work before leaving today. Press "B" on the elevator. The lab is located at the first door on the left as you exit the elevator.  Follow-up in 6-12 months, sooner if needed.   Due to recent changes in healthcare laws, you may see the results of your imaging and laboratory studies on MyChart before your provider has had a chance to review them.  We understand that in some cases there may be results that are confusing or concerning to you. Not all laboratory results come back in the same time frame and the provider may be waiting for multiple results in order to interpret others.  Please give [place] 48 hours in order for your provider to thoroughly review all the results before contacting the office for clarification of your results.   _______________________________________________________  If your blood pressure at your visit was 140/90 or greater, please contact your primary care physician to follow up on this.  _______________________________________________________  If you are age 55 or older, your body mass index should be between 23-30. Your Body mass index is 31.08 kg/m. If this is out of the aforementioned range listed, please consider follow up with your Primary Care Provider.  If you are age 62 or younger, your body mass index should be between 19-25. Your Body mass index is 31.08 kg/m. If this is out of the aformentioned range listed, please consider follow up with your Primary Care Provider.   ________________________________________________________  The [place] [facility] providers would like to encourage you to use [facility] to communicate with providers for non-urgent requests or questions.  Due to long hold times on the telephone, sending your provider a message by [facility] may be a faster and more efficient way to get a response.  Please allow 48 business hours for a response.  Please remember that this is for non-urgent  requests.  _______________________________________________________  Thank you for choosing me and  Gastroenterology.  Dr. Meridee Score

## 2023-06-21 NOTE — Progress Notes (Signed)
GASTROENTEROLOGY OUTPATIENT CLINIC VISIT   Primary Care Provider Sandford Craze, NP [address] [place] [address] [phone]  Patient Profile: Alyssa Berry is a 42 y.o. 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  The patient presents to the [facility] for an evaluation and management of problem(s) noted below:  Problem List 1. Primary biliary cholangitis (HCC)   2. Abnormal LFTs   3. Abnormal liver ultrasound    Discussed the use of AI scribe software for clinical note transcription with the patient, who gave verbal consent to proceed.  History of Present Illness Please see prior GI notes for full details of HPI.  Interval History The patient presents for follow-up in the [facility] clinic.  She has been followed by my partner Dr. Christella Hartigan who will be retiring at the end of this year.  Her diagnosis of PBC was based on her findings of elevated AMA antibodies back in 2019.  Liver biopsy never performed since AP was normal and due to financial issues at the time.  The patient has been monitored periodically with liver tests, which have remained relatively normal but within this year she has had elevations in her transferases.  The patient has not experienced any new abdominal pain, difficulty swallowing, heartburn, reflux, or changes in bowel habits. There is no known family history of liver disease (though she doesn't know her biological father's side of the family).  Periodically, the patient will have severe episodes of intense itching, for up to weeks at a time, with the most recent episode occurring three to four months ago. These episodes do not coincide with elevated liver tests that she is aware of.  The patient has gained thirty pounds in the past two years, with a current stable weight between 162 to 163 pounds.  The patient does not consume  alcohol.   GI Review of Systems Positive as above Negative for dysphagia, odynophagia, nausea, vomiting, alteration of bowel habits, melena, hematochezia  Review of Systems General: Denies fevers/chills/weight loss unintentionally Cardiovascular: Denies chest pain Pulmonary: Denies shortness of breath Gastroenterological: See HPI Genitourinary: Denies darkened urine  Hematological: Denies easy bruising/bleeding Dermatological: Denies jaundice Psychological: Mood is stable  Medications Current Outpatient Medications  Medication Sig Dispense Refill   atorvastatin (LIPITOR) 40 MG tablet Take 1 tablet (40 mg total) by mouth daily. 90 tablet 1   carvedilol (COREG) 12.5 MG tablet Take 1 tablet (12.5 mg total) by mouth 2 (two) times daily with a meal. 180 tablet 1   hydrochlorothiazide (HYDRODIURIL) 25 MG tablet Take 1 tablet (25 mg total) by mouth daily. 90 tablet 1   LO LOESTRIN FE 1 MG-10 MCG / 10 MCG tablet Take 1 tablet by mouth daily. 84 tablet 4   metFORMIN (GLUCOPHAGE) 500 MG tablet Take 1 tablet (500 mg) by mouth 2 times daily with a meal. 180 tablet 1   Multiple Vitamins-Minerals (MULTIVITAL) tablet Take 1 tablet by mouth daily. WITH POTASSIUM     valsartan (DIOVAN) 320 MG tablet Take 1 tablet (320 mg total) by mouth daily. 90 tablet 1   No current facility-administered medications for this visit.    Allergies No Known Allergies  Histories Past Medical History:  Diagnosis Date   Arthritis    Borderline diabetes    Fatty liver    noted on [place] 7/24   Hypertension    Migraine    Primary biliary cirrhosis (HCC)  PVC (premature ventricular contraction)    Past Surgical History:  Procedure Laterality Date   TUBAL LIGATION  2008   Social History   Socioeconomic History   Marital status: Married    Spouse name: Not on file   Number of children: 3   Years of education: Not on file   Highest education level: Not on file  Occupational History   Occupation: CMA   Tobacco Use   Smoking status: Never   Smokeless tobacco: Never  Vaping Use   Vaping status: Never Used  Substance and Sexual Activity   Alcohol use: No    Alcohol/week: 0.0 standard drinks of alcohol   Drug use: No   Sexual activity: Not on file    Comment: tubal ligation  Other Topics Concern   Not on file  Social History Narrative   Married   3 Children   2002- daughter Otho Ket   2004- Cathryn   2008Alphonzo Lemmings   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)   Completed associates degree   Enjoys drawing   Social Determinants of Health   Financial Resource Strain: Not on file  Food Insecurity: Not on file  Transportation Needs: Not on file  Physical Activity: Not on file  Stress: Not on file  Social Connections: Not on file  Intimate Partner Violence: Not on file   Family History  Problem Relation Age of Onset   Hyperlipidemia Mother    Hypertension Mother    Hypertension Sister    Hypertension Brother    Cancer Maternal Grandmother        lung   Hyperlipidemia Maternal Grandmother    Hypertension Maternal Grandmother    Hypertension Maternal Grandfather        died from sepsis   Depression Daughter    Colon cancer Neg Hx    Esophageal cancer Neg Hx    Inflammatory bowel disease Neg Hx    Liver disease Neg Hx    Pancreatic cancer Neg Hx    Rectal cancer Neg Hx    Stomach cancer Neg Hx    I have reviewed her medical, social, and family history in detail and updated the electronic medical record as necessary.    PHYSICAL EXAMINATION  BP (!) 124/90 (BP Location: Left Arm, Patient Position: Sitting, Cuff Size: Normal)   Pulse 86   Ht 5\' 1"  (1.549 m) Comment: height measured without shoes  Wt 164 lb 8 oz (74.6 kg)   BMI 31.08 kg/m  Wt Readings from Last 3 Encounters:  06/21/23 164 lb 8 oz (74.6 kg)  02/22/23 170 lb (77.1 kg)  11/22/22 173 lb (78.5 kg)  GEN: NAD, appears stated age, doesn't appear chronically ill PSYCH: Cooperative, without  pressured speech EYE: Conjunctivae pink, sclerae anicteric ENT: MMM CV: RR without R/Gs  RESP: CTAB posteriorly, without wheezing GI: NABS, soft, NT/ND, rounded, without rebound or guarding, no HSM appreciated MSK/EXT: No significant lower extremity edema SKIN: No jaundice NEURO:  Alert & Oriented x 3, no focal deficits, no evidence of asterixis   REVIEW OF DATA  I reviewed the following data at the time of this encounter:  GI Procedures and Studies  No relevant studies to review  Laboratory Studies  Reviewed those in epic  Imaging Studies  Right upper quadrant ultrasound IMPRESSION: 1. Increased hepatic parenchymal echogenicity suggestive of steatosis. 2. No cholelithiasis or sonographic evidence for acute cholecystitis.   ASSESSMENT  Ms. Galliher is a 42 y.o. 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  The patient is seen today for evaluation and management of:  1. Primary biliary cholangitis (HCC)   2. Abnormal LFTs   3. Abnormal liver ultrasound    The patient is hemodynamically and clinically stable at this time.  It is interesting that this patient's diagnosis has been based solely on blood work with a liver biopsy.  Interestingly for years she has not had any alkaline phosphatase elevation and only recently this year has she been noted to have transferase elevations.  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  I like to see how the repeat liver tests look to make sure that we are not seeing a persistence and elevation.  If there continues to be issues of elevation during the course of this coming months from her last set of laboratories, I will strongly want to consider a liver biopsy as diagnosis of primary biliary cholangitis would be much more important  but also could help clarify the diagnosis of MASLD.  Heart healthy diet and weight loss and good blood pressure control will be helpful as she has metabolic risk factors for MASLD.  All patient questions were answered to the best of my ability, and the patient agrees to the aforementioned plan of action with follow-up as indicated.   PLAN  Laboratories as outlined below Weight loss of 5 to 10% as able Continue good blood pressure control Pending results of the AMA but also the transferase is, will need to consider potential liver biopsy if liver biochemical testing remains abnormal over the course of the next few months   Orders Placed This Encounter  Procedures   CBC   Comp Met (CMET)   IgA   Tissue transglutaminase, IgA   INR/PT   ANA   Mitochondrial antibodies   Anti-smooth muscle antibody, IgG   Gamma GT    New Prescriptions   No medications on file   Modified Medications   No medications on file    Planned Follow Up No follow-ups on file.   Total Time in Face-to-Face and in Coordination of Care for patient including independent/personal interpretation/review of prior testing, medical history, examination, medication adjustment, communicating results with the patient directly, and documentation within the EHR is 25 minutes.   Corliss Parish, MD [facility] Office # [phone]

## 2023-06-24 ENCOUNTER — Other Ambulatory Visit: Payer: Self-pay

## 2023-06-24 NOTE — Progress Notes (Unsigned)
Patient attempted to be outreached by Nelma Rothman, PharmD Candidate to discuss hypertension. [employer] AI assistant did not allow call through, would not allow VM. Nelma Rothman, PharmD Candidate 06/24/2023 12:01 PM

## 2023-06-26 LAB — IGA: Immunoglobulin A: 233 mg/dL (ref 47–310)

## 2023-06-26 LAB — ANTI-NUCLEAR AB-TITER (ANA TITER): ANA Titer 1: 1:80 {titer} — ABNORMAL HIGH

## 2023-06-26 LAB — MITOCHONDRIAL ANTIBODIES: Mitochondrial M2 Ab, IgG: 47.1 U — ABNORMAL HIGH (ref <=20.0)

## 2023-06-26 LAB — ANA: Anti Nuclear Antibody (ANA): POSITIVE — AB

## 2023-06-26 LAB — ANTI-SMOOTH MUSCLE ANTIBODY, IGG: Actin (Smooth Muscle) Antibody (IGG): <20 U (ref <20)

## 2023-06-26 LAB — TISSUE TRANSGLUTAMINASE, IGA: (tTG) Ab, IgA: <1 U/mL

## 2023-06-28 ENCOUNTER — Other Ambulatory Visit: Payer: Self-pay

## 2023-06-28 DIAGNOSIS — K743 Primary biliary cirrhosis: Secondary | ICD-10-CM

## 2023-07-15 ENCOUNTER — Other Ambulatory Visit (HOSPITAL_COMMUNITY): Payer: Self-pay

## 2023-07-15 ENCOUNTER — Other Ambulatory Visit: Payer: Self-pay

## 2023-08-02 ENCOUNTER — Other Ambulatory Visit (HOSPITAL_COMMUNITY): Payer: Self-pay

## 2023-08-09 ENCOUNTER — Ambulatory Visit: Payer: 59 | Admitting: Family

## 2023-08-23 ENCOUNTER — Ambulatory Visit: Payer: 59 | Admitting: Family

## 2023-09-13 ENCOUNTER — Other Ambulatory Visit: Payer: Self-pay | Admitting: Family

## 2023-09-14 ENCOUNTER — Other Ambulatory Visit (HOSPITAL_COMMUNITY): Payer: Self-pay

## 2023-09-14 MED ORDER — ATORVASTATIN CALCIUM 40 MG PO TABS
40.0000 mg | ORAL_TABLET | Freq: Every day | ORAL | 1 refills | Status: DC
  Filled 2023-09-14: qty 90, 90d supply, fill #0
  Filled 2023-12-12: qty 90, 90d supply, fill #1

## 2023-09-14 MED ORDER — VALSARTAN 320 MG PO TABS
320.0000 mg | ORAL_TABLET | Freq: Every day | ORAL | 1 refills | Status: DC
  Filled 2023-09-14: qty 90, 90d supply, fill #0
  Filled 2023-12-12: qty 90, 90d supply, fill #1

## 2023-09-20 ENCOUNTER — Ambulatory Visit: Payer: 59 | Admitting: Family

## 2023-09-23 ENCOUNTER — Other Ambulatory Visit: Payer: Self-pay

## 2023-09-23 ENCOUNTER — Other Ambulatory Visit (HOSPITAL_COMMUNITY): Payer: Self-pay

## 2023-09-23 ENCOUNTER — Telehealth: Payer: Commercial Managed Care - PPO | Admitting: Family Medicine

## 2023-09-23 ENCOUNTER — Encounter (HOSPITAL_COMMUNITY): Payer: Self-pay

## 2023-09-23 DIAGNOSIS — J40 Bronchitis, not specified as acute or chronic: Secondary | ICD-10-CM | POA: Diagnosis not present

## 2023-09-23 MED ORDER — AZITHROMYCIN 250 MG PO TABS
ORAL_TABLET | ORAL | 0 refills | Status: AC
  Filled 2023-09-23: qty 6, 5d supply, fill #0

## 2023-09-23 MED ORDER — ALBUTEROL SULFATE HFA 108 (90 BASE) MCG/ACT IN AERS
1.0000 | INHALATION_SPRAY | Freq: Four times a day (QID) | RESPIRATORY_TRACT | 0 refills | Status: DC | PRN
  Filled 2023-09-23: qty 6.7, 25d supply, fill #0

## 2023-09-23 NOTE — Progress Notes (Signed)
E-Visit for Cough   We are sorry that you are not feeling well.  Here is how we plan to help!  Based on your presentation I believe you most likely have A cough due to bacteria.  When patients have a fever and a productive cough with a change in color or increased sputum production, we are concerned about bacterial bronchitis.  If left untreated it can progress to pneumonia.  If your symptoms do not improve with your treatment plan it is important that you contact your provider.   I have prescribed Azithromyin 250 mg: two tablets now and then one tablet daily for 4 additonal days    In addition you may use A non-prescription cough medication called Robitussin DAC. Take 2 teaspoons every 8 hours or Delsym: take 2 teaspoons every 12 hours.  I will order an inhaler for you to have as well to use for cough, wheezing, or shortness of breath.   From your responses in the eVisit questionnaire you describe inflammation in the upper respiratory tract which is causing a significant cough.  This is commonly called Bronchitis and has four common causes:   Allergies Viral Infections Acid Reflux Bacterial Infection Allergies, viruses and acid reflux are treated by controlling symptoms or eliminating the cause. An example might be a cough caused by taking certain blood pressure medications. You stop the cough by changing the medication. Another example might be a cough caused by acid reflux. Controlling the reflux helps control the cough.  USE OF BRONCHODILATOR ("RESCUE") INHALERS: There is a risk from using your bronchodilator too frequently.  The risk is that over-reliance on a medication which only relaxes the muscles surrounding the breathing tubes can reduce the effectiveness of medications prescribed to reduce swelling and congestion of the tubes themselves.  Although you feel brief relief from the bronchodilator inhaler, your asthma may actually be worsening with the tubes becoming more swollen and filled  with mucus.  This can delay other crucial treatments, such as oral steroid medications. If you need to use a bronchodilator inhaler daily, several times per day, you should discuss this with your provider.  There are probably better treatments that could be used to keep your asthma under control.     HOME CARE Only take medications as instructed by your medical team. Complete the entire course of an antibiotic. Drink plenty of fluids and get plenty of rest. Avoid close contacts especially the very young and the elderly Cover your mouth if you cough or cough into your sleeve. Always remember to wash your hands A steam or ultrasonic humidifier can help congestion.   GET HELP RIGHT AWAY IF: You develop worsening fever. You become short of breath You cough up blood. Your symptoms persist after you have completed your treatment plan MAKE SURE YOU  Understand these instructions. Will watch your condition. Will get help right away if you are not doing well or get worse.    Thank you for choosing an e-visit.  Your e-visit answers were reviewed by a board certified advanced clinical practitioner to complete your personal care plan. Depending upon the condition, your plan could have included both over the counter or prescription medications.  Please review your pharmacy choice. Make sure the pharmacy is open so you can pick up prescription now. If there is a problem, you may contact your provider through [employer] and have the prescription routed to another pharmacy.  Your safety is important to [place]. If you have drug allergies check your  prescription carefully.   For the next 24 hours you can use MyChart to ask questions about today's visit, request a non-urgent call back, or ask for a work or school excuse. You will get an email in the next two days asking about your experience. I hope that your e-visit has been valuable and will speed your recovery.  I provided 5 minutes of non  face-to-face time during this encounter for chart review, medication and order placement, as well as and documentation.

## 2023-10-08 ENCOUNTER — Other Ambulatory Visit (HOSPITAL_COMMUNITY): Payer: Self-pay

## 2023-10-08 ENCOUNTER — Other Ambulatory Visit: Payer: Self-pay | Admitting: Family

## 2023-10-08 MED ORDER — METFORMIN HCL 500 MG PO TABS
500.0000 mg | ORAL_TABLET | Freq: Two times a day (BID) | ORAL | 1 refills | Status: DC
  Filled 2023-10-08: qty 180, 90d supply, fill #0
  Filled 2024-01-15: qty 180, 90d supply, fill #1

## 2023-10-08 MED ORDER — HYDROCHLOROTHIAZIDE 25 MG PO TABS
25.0000 mg | ORAL_TABLET | Freq: Every day | ORAL | 1 refills | Status: DC
  Filled 2023-10-08: qty 90, 90d supply, fill #0
  Filled 2024-01-15: qty 90, 90d supply, fill #1

## 2023-10-08 MED ORDER — CARVEDILOL 12.5 MG PO TABS
12.5000 mg | ORAL_TABLET | Freq: Two times a day (BID) | ORAL | 1 refills | Status: DC
  Filled 2023-10-08: qty 180, 90d supply, fill #0
  Filled 2024-01-15: qty 180, 90d supply, fill #1

## 2023-10-13 IMAGING — DX DG FOOT COMPLETE 3+V*L*
3 series · 3 of 3 positions shown · non-contrast
Comparison: None.

CLINICAL DATA: Left great toe bruising, foot swelling. Injury 2
days ago.

EXAM:
LEFT FOOT - COMPLETE 3+ VIEW

[foot ap]
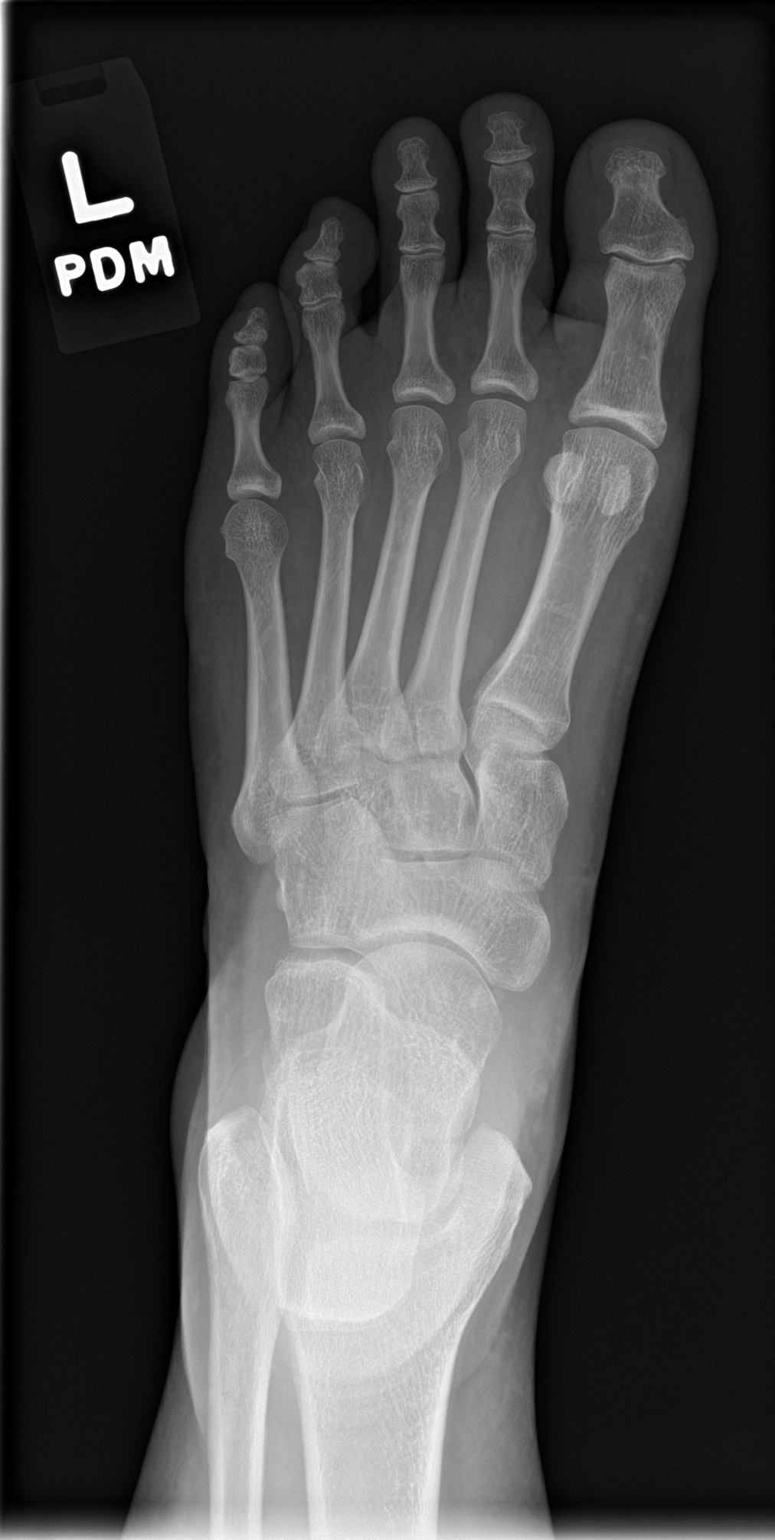

[foot obl]
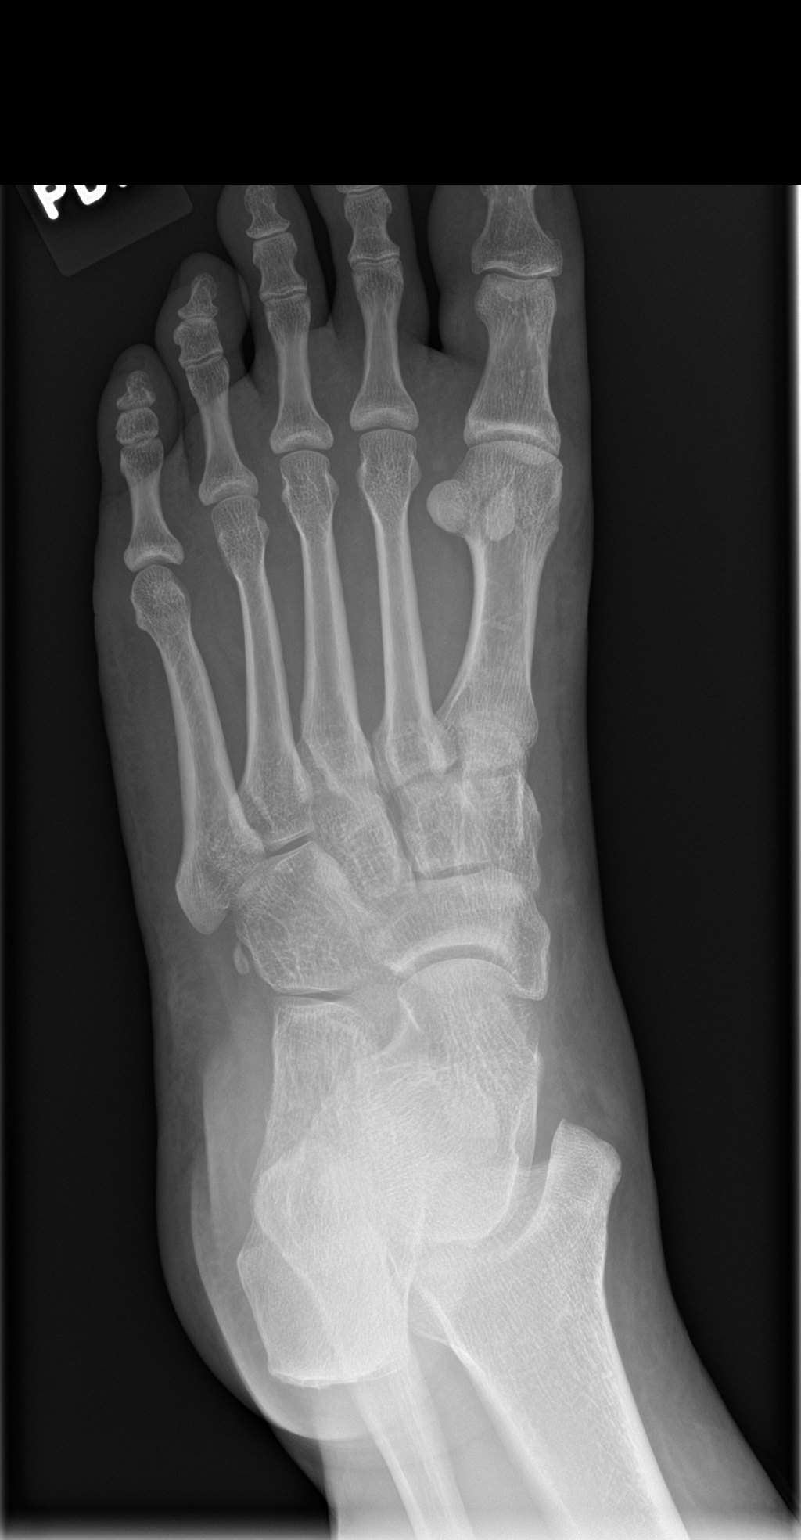

[foot lat]
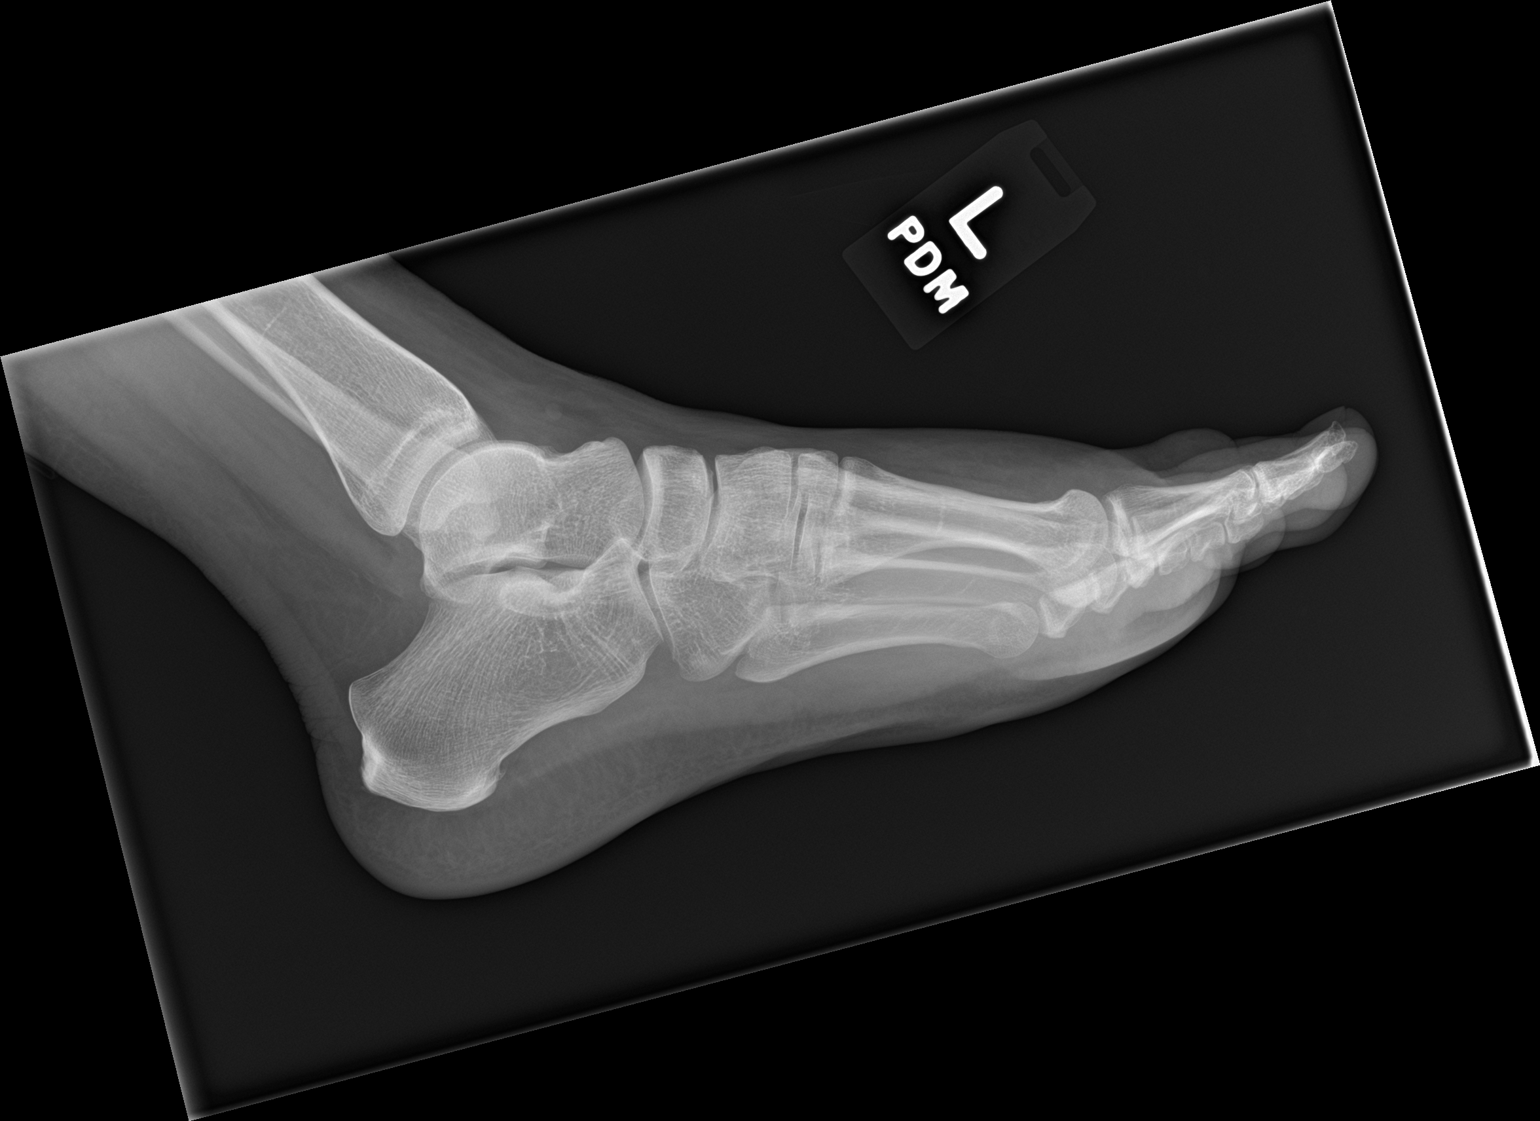

[3 of 3 positions shown; findings below may reference images not displayed]

FINDINGS: There is no evidence of fracture or dislocation. Particularly, no
fracture of the great toe. There is no evidence of arthropathy or
other focal bone abnormality. There is soft tissue edema over the
dorsum of the forefoot.
IMPRESSION: Soft tissue edema without acute fracture.

## 2023-10-25 ENCOUNTER — Other Ambulatory Visit: Payer: Self-pay

## 2023-11-22 ENCOUNTER — Ambulatory Visit: Payer: 59 | Admitting: Family

## 2023-12-12 ENCOUNTER — Other Ambulatory Visit (HOSPITAL_COMMUNITY): Payer: Self-pay

## 2023-12-13 ENCOUNTER — Other Ambulatory Visit (HOSPITAL_COMMUNITY): Payer: Self-pay

## 2023-12-13 ENCOUNTER — Ambulatory Visit: Admitting: Family

## 2023-12-13 ENCOUNTER — Other Ambulatory Visit: Payer: Self-pay

## 2023-12-13 VITALS — BP 117/76 | HR 77 | Temp 97.7°F | Resp 16 | Ht 61.0 in | Wt 170.0 lb

## 2023-12-13 DIAGNOSIS — E785 Hyperlipidemia, unspecified: Secondary | ICD-10-CM

## 2023-12-13 DIAGNOSIS — R7303 Prediabetes: Secondary | ICD-10-CM

## 2023-12-13 DIAGNOSIS — I10 Essential (primary) hypertension: Secondary | ICD-10-CM | POA: Diagnosis not present

## 2023-12-13 DIAGNOSIS — G43809 Other migraine, not intractable, without status migrainosus: Secondary | ICD-10-CM

## 2023-12-13 LAB — HEMOGLOBIN A1C: Hgb A1c MFr Bld: 6.6 % — ABNORMAL HIGH (ref 4.6–6.5)

## 2023-12-13 LAB — LIPID PANEL
Cholesterol: 158 mg/dL (ref 0–200)
HDL: 43.5 mg/dL (ref >39.00)
LDL Cholesterol: 65 mg/dL (ref 0–99)
NonHDL: 114.43
Total CHOL/HDL Ratio: 4
Triglycerides: 249 mg/dL — ABNORMAL HIGH (ref 0.0–149.0)
VLDL: 49.8 mg/dL — ABNORMAL HIGH (ref 0.0–40.0)

## 2023-12-13 LAB — COMPREHENSIVE METABOLIC PANEL WITH GFR
ALT: 41 U/L — ABNORMAL HIGH (ref 0–35)
AST: 55 U/L — ABNORMAL HIGH (ref 0–37)
Albumin: 4.2 g/dL (ref 3.5–5.2)
Alkaline Phosphatase: 85 U/L (ref 39–117)
BUN: 11 mg/dL (ref 6–23)
CO2: 27 meq/L (ref 19–32)
Calcium: 8.9 mg/dL (ref 8.4–10.5)
Chloride: 100 meq/L (ref 96–112)
Creatinine, Ser: 0.61 mg/dL (ref 0.40–1.20)
GFR: 109.78 mL/min (ref >60.00)
Glucose, Bld: 104 mg/dL — ABNORMAL HIGH (ref 70–99)
Potassium: 3.7 meq/L (ref 3.5–5.1)
Sodium: 139 meq/L (ref 135–145)
Total Bilirubin: 0.4 mg/dL (ref 0.2–1.2)
Total Protein: 7.4 g/dL (ref 6.0–8.3)

## 2023-12-13 MED ORDER — ATORVASTATIN CALCIUM 40 MG PO TABS
40.0000 mg | ORAL_TABLET | Freq: Every day | ORAL | 1 refills | Status: DC
  Filled 2023-12-13 – 2024-03-24 (×3): qty 90, 90d supply, fill #0
  Filled 2024-06-24: qty 90, 90d supply, fill #1

## 2023-12-13 MED ORDER — BLOOD GLUCOSE MONITORING SUPPL DEVI
1.0000 | Freq: Three times a day (TID) | 0 refills | Status: DC
  Filled 2023-12-13: qty 1, fill #0

## 2023-12-13 MED ORDER — LANCETS MISC. MISC
1.0000 | Freq: Three times a day (TID) | 0 refills | Status: AC
  Filled 2023-12-13: qty 100, 30d supply, fill #0

## 2023-12-13 MED ORDER — LANCET DEVICE MISC
1.0000 | Freq: Three times a day (TID) | 0 refills | Status: AC
  Filled 2023-12-13: qty 1, 30d supply, fill #0

## 2023-12-13 MED ORDER — ONETOUCH ULTRA VI STRP
ORAL_STRIP | 0 refills | Status: AC
  Filled 2023-12-13: qty 100, 30d supply, fill #0

## 2023-12-13 MED ORDER — VALSARTAN 320 MG PO TABS
320.0000 mg | ORAL_TABLET | Freq: Every day | ORAL | 1 refills | Status: DC
  Filled 2023-12-13 – 2024-03-24 (×3): qty 90, 90d supply, fill #0
  Filled 2024-06-24: qty 90, 90d supply, fill #1

## 2023-12-13 NOTE — Assessment & Plan Note (Addendum)
 Lab Results  Component Value Date   CHOL 145 02/22/2023   HDL 36 (L) 02/22/2023   LDLCALC  02/22/2023     Comment:     . LDL cholesterol not calculated. Triglyceride levels greater than 400 mg/dL invalidate calculated LDL results. . Reference range: <100 . Desirable range <100 mg/dL for primary prevention;   <70 mg/dL for patients with CHD or diabetic patients  with > or = 2 CHD risk factors. Marland Kitchen LDL-C is now calculated using the Martin-Hopkins  calculation, which is a validated novel method providing  better accuracy than the Friedewald equation in the  estimation of LDL-C.  Horald Pollen et al. Lenox Ahr. 1610;960(45): 2061-2068  (http://education.[facility].com/faq/FAQ164)    LDLDIRECT 66.0 11/22/2022   TRIG 407 (H) 02/22/2023   CHOLHDL 4.0 02/22/2023   Continues statin, update lipid panel.

## 2023-12-13 NOTE — Assessment & Plan Note (Addendum)
 Worse migraines during rainy weather like today. Uses excedrin prn.  Found maxalt/imitrex too sedating and previously Nurtec was not covered.  Continue excedrin prn.

## 2023-12-13 NOTE — Progress Notes (Signed)
 Subjective:     Patient ID: Alyssa Berry, female    DOB: Jul 15, 1981, 43 y.o.   MRN: [id number]  Chief Complaint  Patient presents with   Diabetes    Here for follow up   Hypertension    Here for follow up    Diabetes  Hypertension    Discussed the use of AI scribe software for clinical note transcription with the patient, who gave verbal consent to proceed.  History of Present Illness   Alyssa Berry "Johny Drilling" is a 43 year old female with type 2 diabetes and hypertension who presents for a routine follow-up visit.  She manages her type 2 diabetes with metformin. Her last hemoglobin A1c was 6.4% over the summer, indicating borderline control. She rarely checks her blood sugar and does not have a glucose meter at home. Increased stress due to her daughter's divorce and her stepfather's worsening dementia has led to poor eating habits and using food as a stress reliever.  Her blood pressure is well-controlled with carvedilol 12.5 mg, hydrochlorothiazide, and valsartan. She experiences frequent urination and leg cramps, which may be related to low potassium or high blood sugar.  She experiences migraines, particularly when the weather is poor, feeling pressure around her forehead and eyes. These migraines can last two to three days. She takes Excedrin, sometimes up to three times a day, including a nighttime dose if needed. She has tried Imitrex in the past but found it made her too drowsy.  Her cholesterol was checked in June, showing high triglycerides. She is currently on atorvastatin.    Health Maintenance Due  Topic Date Due   Pneumococcal Vaccine 25-74 Years old (1 of 2 - PCV) Never done   COVID-19 Vaccine (4 - 2024-25 season) 05/05/2023    Past Medical History:  Diagnosis Date   Arthritis    Borderline diabetes    Fatty liver    noted on [place] 7/24   Hypertension    Migraine    Primary biliary cirrhosis (HCC)    PVC (premature ventricular contraction)      Past Surgical History:  Procedure Laterality Date   TUBAL LIGATION  2008    Family History  Problem Relation Age of Onset   Hyperlipidemia Mother    Hypertension Mother    Hypertension Sister    Hypertension Brother    Cancer Maternal Grandmother        lung   Hyperlipidemia Maternal Grandmother    Hypertension Maternal Grandmother    Hypertension Maternal Grandfather        died from sepsis   Depression Daughter    Colon cancer Neg Hx    Esophageal cancer Neg Hx    Inflammatory bowel disease Neg Hx    Liver disease Neg Hx    Pancreatic cancer Neg Hx    Rectal cancer Neg Hx    Stomach cancer Neg Hx     Social History   Socioeconomic History   Marital status: Married    Spouse name: Not on file   Number of children: 3   Years of education: Not on file   Highest education level: Not on file  Occupational History   Occupation: CMA  Tobacco Use   Smoking status: Never   Smokeless tobacco: Never  Vaping Use   Vaping status: Never Used  Substance and Sexual Activity   Alcohol use: No    Alcohol/week: 0.0 standard drinks of alcohol   Drug use: No   Sexual activity: Not on file  Comment: tubal ligation  Other Topics Concern   Not on file  Social History Narrative   Married   3 Children   2002- daughter Otho Ket   2004- Cathryn   2008Alphonzo Lemmings   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)   Completed associates degree   Enjoys drawing   Social Drivers of Health   Financial Resource Strain: Patient Declined (12/13/2023)   Overall Financial Resource Strain (CARDIA)    Difficulty of Paying Living Expenses: Patient declined  Food Insecurity: Unknown (12/13/2023)   Hunger Vital Sign    Worried About Running Out of Food in the Last Year: Not on file    Ran Out of Food in the Last Year: Patient declined  Transportation Needs: Patient Declined (12/13/2023)   PRAPARE - Administrator, Civil Service (Medical): Patient declined    Lack of  Transportation (Non-Medical): Patient declined  Physical Activity: Insufficiently Active (12/13/2023)   Exercise Vital Sign    Days of Exercise per Week: 3 days    Minutes of Exercise per Session: 30 min  Stress: Stress Concern Present (12/13/2023)   [employer] of Occupational Health - Occupational Stress Questionnaire    Feeling of Stress : To some extent  Social Connections: Unknown (12/13/2023)   Social Connection and Isolation Panel [NHANES]    Frequency of Communication with Friends and Family: Once a week    Frequency of Social Gatherings with Friends and Family: More than three times a week    Attends Religious Services: Patient declined    Database administrator or Organizations: No    Attends Engineer, structural: Not on file    Marital Status: Married  Catering manager Violence: Not on file    Outpatient Medications Prior to Visit  Medication Sig Dispense Refill   carvedilol (COREG) 12.5 MG tablet Take 1 tablet (12.5 mg total) by mouth 2 (two) times daily with a meal. 180 tablet 1   hydrochlorothiazide (HYDRODIURIL) 25 MG tablet Take 1 tablet (25 mg total) by mouth daily. 90 tablet 1   LO LOESTRIN FE 1 MG-10 MCG / 10 MCG tablet Take 1 tablet by mouth daily. 84 tablet 4   metFORMIN (GLUCOPHAGE) 500 MG tablet Take 1 tablet (500 mg) by mouth 2 times daily with a meal. 180 tablet 1   Multiple Vitamins-Minerals (MULTIVITAL) tablet Take 1 tablet by mouth daily. WITH POTASSIUM     atorvastatin (LIPITOR) 40 MG tablet Take 1 tablet (40 mg total) by mouth daily. 90 tablet 1   valsartan (DIOVAN) 320 MG tablet Take 1 tablet (320 mg total) by mouth daily. 90 tablet 1   albuterol (VENTOLIN HFA) 108 (90 Base) MCG/ACT inhaler Inhale 1-2 puffs into the lungs every 6 (six) hours as needed for wheezing or shortness of breath (cough). 8 g 0   No facility-administered medications prior to visit.    No Known Allergies  ROS    See HPI Objective:    Physical  Exam Constitutional:      General: She is not in acute distress.    Appearance: Normal appearance. She is well-developed.  HENT:     Head: Normocephalic and atraumatic.     Right Ear: External ear normal.     Left Ear: External ear normal.  Eyes:     General: No scleral icterus. Neck:     Thyroid: No thyromegaly.  Cardiovascular:     Rate and Rhythm: Normal rate and regular rhythm.  Heart sounds: Normal heart sounds. No murmur heard. Pulmonary:     Effort: Pulmonary effort is normal. No respiratory distress.     Breath sounds: Normal breath sounds. No wheezing.  Musculoskeletal:     Cervical back: Neck supple.  Skin:    General: Skin is warm and dry.  Neurological:     Mental Status: She is alert and oriented to person, place, and time.  Psychiatric:        Mood and Affect: Mood normal.        Behavior: Behavior normal.        Thought Content: Thought content normal.        Judgment: Judgment normal.      BP 117/76 (BP Location: Right Arm, Patient Position: Sitting, Cuff Size: Normal)   Pulse 77   Temp 97.7 F (36.5 C) (Oral)   Resp 16   Ht 5\' 1"  (1.549 m)   Wt 170 lb (77.1 kg)   SpO2 99%   BMI 32.12 kg/m  Wt Readings from Last 3 Encounters:  12/13/23 170 lb (77.1 kg)  06/21/23 164 lb 8 oz (74.6 kg)  02/22/23 170 lb (77.1 kg)       Assessment & Plan:   Problem List Items Addressed This Visit       Unprioritized   Primary hypertension   Relevant Medications   valsartan (DIOVAN) 320 MG tablet   atorvastatin (LIPITOR) 40 MG tablet   Migraines   Worse migraines during rainy weather like today. Uses excedrin prn.  Found maxalt/imitrex too sedating and previously Nurtec was not covered.  Continue excedrin prn.       Relevant Medications   valsartan (DIOVAN) 320 MG tablet   atorvastatin (LIPITOR) 40 MG tablet   Hyperlipidemia   Lab Results  Component Value Date   CHOL 145 02/22/2023   HDL 36 (L) 02/22/2023   LDLCALC  02/22/2023     Comment:      . LDL cholesterol not calculated. Triglyceride levels greater than 400 mg/dL invalidate calculated LDL results. . Reference range: <100 . Desirable range <100 mg/dL for primary prevention;   <70 mg/dL for patients with CHD or diabetic patients  with > or = 2 CHD risk factors. Marland Kitchen LDL-C is now calculated using the Martin-Hopkins  calculation, which is a validated novel method providing  better accuracy than the Friedewald equation in the  estimation of LDL-C.  Horald Pollen et al. Lenox Ahr. 1610;960(45): 2061-2068  (http://education.[facility].com/faq/FAQ164)    LDLDIRECT 66.0 11/22/2022   TRIG 407 (H) 02/22/2023   CHOLHDL 4.0 02/22/2023   Continues statin, update lipid panel.       Relevant Medications   valsartan (DIOVAN) 320 MG tablet   atorvastatin (LIPITOR) 40 MG tablet   Other Relevant Orders   Lipid panel   Borderline diabetes - Primary   Lab Results  Component Value Date   HGBA1C 6.4 (H) 02/22/2023   HGBA1C 6.4 11/22/2022   HGBA1C 6.3 04/30/2022   Lab Results  Component Value Date   [facility]  02/22/2023     Comment:     . LDL cholesterol not calculated. Triglyceride levels greater than 400 mg/dL invalidate calculated LDL results. . Reference range: <100 . Desirable range <100 mg/dL for primary prevention;   <70 mg/dL for patients with CHD or diabetic patients  with > or = 2 CHD risk factors. Marland Kitchen LDL-C is now calculated using the Martin-Hopkins  calculation, which is a validated novel method providing  better accuracy than the Friedewald equation  in the  estimation of LDL-C.  Horald Pollen et al. Lenox Ahr. 1610;960(45): 2061-2068  (http://education.[facility].com/faq/FAQ164)    CREATININE 0.58 06/21/2023         Relevant Medications   Blood Glucose Monitoring Suppl DEVI   Lancet Device MISC   Lancets Misc. MISC   Other Relevant Orders   HgB A1c   Comp Met (CMET)    I have discontinued Isamar Swingler "Chan"'s albuterol. I am also having her  start on Blood Glucose Monitoring Suppl, OneTouch Ultra, Lancet Device, and Lancets Misc.. Additionally, I am having her maintain her Multivital, Lo Loestrin Fe, carvedilol, hydrochlorothiazide, metFORMIN, valsartan, and atorvastatin.  Meds ordered this encounter  Medications   valsartan (DIOVAN) 320 MG tablet    Sig: Take 1 tablet (320 mg total) by mouth daily.    Dispense:  90 tablet    Refill:  1    Supervising Provider:   Danise Edge A [[id number]]   atorvastatin (LIPITOR) 40 MG tablet    Sig: Take 1 tablet (40 mg total) by mouth daily.    Dispense:  90 tablet    Refill:  1    Supervising Provider:   Danise Edge A [[id number]]   Blood Glucose Monitoring Suppl DEVI    Sig: 1 each by Does not apply route in the morning, at noon, and at bedtime. May substitute to any manufacturer covered by patient's insurance.    Dispense:  1 each    Refill:  0    Please dispense one touch ultra    Supervising Provider:   Danise Edge A [[id number]]   glucose blood (ONETOUCH ULTRA) test strip    Sig: Use as instructed    Dispense:  100 each    Refill:  0    Supervising Provider:   Danise Edge A [[id number]]   Lancet Device MISC    Sig: 1 each by Does not apply route in the morning, at noon, and at bedtime. May substitute to any manufacturer covered by patient's insurance.    Dispense:  1 each    Refill:  0    Supervising Provider:   Danise Edge A [[id number]]   Lancets Misc. MISC    Sig: 1 each by Does not apply route in the morning, at noon, and at bedtime. May substitute to any manufacturer covered by patient's insurance.    Dispense:  100 each    Refill:  0    Supervising Provider:   Danise Edge A [[id number]]

## 2023-12-13 NOTE — Patient Instructions (Signed)
 VISIT SUMMARY:  You came in today for a routine follow-up visit to manage your type 2 diabetes, hypertension, and other health concerns. We discussed your current medications, symptoms, and lifestyle factors affecting your health.  YOUR PLAN:  -TYPE 2 DIABETES MELLITUS: Type 2 diabetes is a condition where your body does not use insulin properly, leading to high blood sugar levels. Your hemoglobin A1c was 6.4%, indicating borderline control. We will order a hemoglobin A1c test and send a glucometer to your pharmacy to help you monitor your blood sugar at home.  -MIGRAINE: Migraines are severe headaches often triggered by factors like weather changes. You have been using Excedrin for relief.  -HYPERTENSION: Hypertension, or high blood pressure, is well-controlled with your current medications. You reported increased urination and leg cramps, which may be related to your medication. We may check your potassium levels if these symptoms persist.  -HYPERLIPIDEMIA: Hyperlipidemia is a condition where you have high levels of fats (lipids) in your blood. Your triglycerides were high, and you are currently on atorvastatin. Continue taking atorvastatin, and we will monitor your lipid levels.  -GENERAL HEALTH MAINTENANCE: You are scheduled for a gynecological exam and mammogram in June. Please ensure you complete these important health screenings.  INSTRUCTIONS:  Please schedule a follow-up appointment in six months to re-evaluate your hemoglobin A1c and overall health. If your symptoms of increased urination and leg cramps persist, we may need to check your potassium levels. Additionally, if your insurance coverage changes, consider retrying Nurtec for your migraines.

## 2023-12-13 NOTE — Assessment & Plan Note (Addendum)
 Lab Results  Component Value Date   HGBA1C 6.4 (H) 02/22/2023   HGBA1C 6.4 11/22/2022   HGBA1C 6.3 04/30/2022   Lab Results  Component Value Date   [facility]  02/22/2023     Comment:     . LDL cholesterol not calculated. Triglyceride levels greater than 400 mg/dL invalidate calculated LDL results. . Reference range: <100 . Desirable range <100 mg/dL for primary prevention;   <70 mg/dL for patients with CHD or diabetic patients  with > or = 2 CHD risk factors. Marland Kitchen LDL-C is now calculated using the Martin-Hopkins  calculation, which is a validated novel method providing  better accuracy than the Friedewald equation in the  estimation of LDL-C.  Horald Pollen et al. Lenox Ahr. 4098;119(14): 2061-2068  (http://education.[facility].com/faq/FAQ164)    CREATININE 0.58 06/21/2023   Continue work on DM diet, exercise. Continue metformin, update A1C.

## 2023-12-14 ENCOUNTER — Other Ambulatory Visit (HOSPITAL_COMMUNITY): Payer: Self-pay

## 2023-12-14 ENCOUNTER — Encounter: Payer: Self-pay | Admitting: Family

## 2023-12-16 ENCOUNTER — Other Ambulatory Visit (HOSPITAL_COMMUNITY): Payer: Self-pay

## 2023-12-17 ENCOUNTER — Other Ambulatory Visit (HOSPITAL_COMMUNITY): Payer: Self-pay

## 2024-01-15 ENCOUNTER — Other Ambulatory Visit: Payer: Self-pay

## 2024-01-17 ENCOUNTER — Other Ambulatory Visit: Payer: Self-pay

## 2024-01-17 ENCOUNTER — Other Ambulatory Visit (HOSPITAL_COMMUNITY): Payer: Self-pay

## 2024-01-17 DIAGNOSIS — Z01419 Encounter for gynecological examination (general) (routine) without abnormal findings: Secondary | ICD-10-CM | POA: Diagnosis not present

## 2024-01-17 DIAGNOSIS — Z130 Encounter for screening for diseases of the blood and blood-forming organs and certain disorders involving the immune mechanism: Secondary | ICD-10-CM | POA: Diagnosis not present

## 2024-01-17 DIAGNOSIS — Z1389 Encounter for screening for other disorder: Secondary | ICD-10-CM | POA: Diagnosis not present

## 2024-01-17 DIAGNOSIS — Z3040 Encounter for surveillance of contraceptives, unspecified: Secondary | ICD-10-CM | POA: Diagnosis not present

## 2024-01-17 MED ORDER — LO LOESTRIN FE 1 MG-10 MCG / 10 MCG PO TABS
1.0000 | ORAL_TABLET | Freq: Every day | ORAL | 5 refills | Status: AC
  Filled 2024-01-17: qty 84, 84d supply, fill #0
  Filled 2024-04-14: qty 84, 84d supply, fill #1
  Filled 2024-07-22: qty 84, 84d supply, fill #2

## 2024-03-19 ENCOUNTER — Other Ambulatory Visit (HOSPITAL_COMMUNITY): Payer: Self-pay

## 2024-03-19 ENCOUNTER — Other Ambulatory Visit: Payer: Self-pay

## 2024-03-23 ENCOUNTER — Other Ambulatory Visit (HOSPITAL_COMMUNITY): Payer: Self-pay

## 2024-03-24 ENCOUNTER — Other Ambulatory Visit: Payer: Self-pay

## 2024-03-24 ENCOUNTER — Other Ambulatory Visit: Payer: Self-pay | Admitting: Obstetrics and Gynecology

## 2024-03-24 ENCOUNTER — Other Ambulatory Visit (HOSPITAL_COMMUNITY): Payer: Self-pay

## 2024-03-24 DIAGNOSIS — Z1231 Encounter for screening mammogram for malignant neoplasm of breast: Secondary | ICD-10-CM

## 2024-04-10 ENCOUNTER — Ambulatory Visit
Admission: RE | Admit: 2024-04-10 | Discharge: 2024-04-10 | Disposition: A | Discharge status: Home / Self Care | Source: Ambulatory Visit

## 2024-04-10 DIAGNOSIS — Z1231 Encounter for screening mammogram for malignant neoplasm of breast: Secondary | ICD-10-CM

## 2024-04-14 ENCOUNTER — Other Ambulatory Visit (HOSPITAL_COMMUNITY): Payer: Self-pay

## 2024-04-14 ENCOUNTER — Other Ambulatory Visit: Payer: Self-pay | Admitting: Family

## 2024-04-14 ENCOUNTER — Other Ambulatory Visit: Payer: Self-pay

## 2024-04-14 DIAGNOSIS — I10 Essential (primary) hypertension: Secondary | ICD-10-CM

## 2024-04-14 DIAGNOSIS — R7303 Prediabetes: Secondary | ICD-10-CM

## 2024-04-14 MED ORDER — CARVEDILOL 12.5 MG PO TABS
12.5000 mg | ORAL_TABLET | Freq: Two times a day (BID) | ORAL | 0 refills | Status: DC
  Filled 2024-04-14: qty 180, 90d supply, fill #0

## 2024-04-14 MED ORDER — HYDROCHLOROTHIAZIDE 25 MG PO TABS
25.0000 mg | ORAL_TABLET | Freq: Every day | ORAL | 0 refills | Status: DC
  Filled 2024-04-14: qty 90, 90d supply, fill #0

## 2024-04-14 MED ORDER — METFORMIN HCL 500 MG PO TABS
500.0000 mg | ORAL_TABLET | Freq: Two times a day (BID) | ORAL | 0 refills | Status: DC
  Filled 2024-04-14: qty 180, 90d supply, fill #0

## 2024-05-12 ENCOUNTER — Other Ambulatory Visit (HOSPITAL_COMMUNITY): Payer: Self-pay

## 2024-06-19 ENCOUNTER — Ambulatory Visit: Admitting: Family

## 2024-06-24 ENCOUNTER — Other Ambulatory Visit (HOSPITAL_COMMUNITY): Payer: Self-pay

## 2024-06-26 ENCOUNTER — Ambulatory Visit: Admitting: Family

## 2024-07-22 ENCOUNTER — Other Ambulatory Visit: Payer: Self-pay

## 2024-07-22 ENCOUNTER — Other Ambulatory Visit: Payer: Self-pay | Admitting: Family

## 2024-07-22 ENCOUNTER — Other Ambulatory Visit (HOSPITAL_COMMUNITY): Payer: Self-pay

## 2024-07-22 DIAGNOSIS — I10 Essential (primary) hypertension: Secondary | ICD-10-CM

## 2024-07-22 DIAGNOSIS — R7303 Prediabetes: Secondary | ICD-10-CM

## 2024-07-23 ENCOUNTER — Other Ambulatory Visit: Payer: Self-pay

## 2024-07-23 ENCOUNTER — Other Ambulatory Visit (HOSPITAL_COMMUNITY): Payer: Self-pay

## 2024-07-23 MED ORDER — CARVEDILOL 12.5 MG PO TABS
12.5000 mg | ORAL_TABLET | Freq: Two times a day (BID) | ORAL | 0 refills | Status: AC
  Filled 2024-07-23: qty 180, 90d supply, fill #0

## 2024-07-23 MED ORDER — METFORMIN HCL 500 MG PO TABS
500.0000 mg | ORAL_TABLET | Freq: Two times a day (BID) | ORAL | 0 refills | Status: AC
  Filled 2024-07-23: qty 180, 90d supply, fill #0

## 2024-07-23 MED ORDER — HYDROCHLOROTHIAZIDE 25 MG PO TABS
25.0000 mg | ORAL_TABLET | Freq: Every day | ORAL | 0 refills | Status: AC
  Filled 2024-07-23: qty 90, 90d supply, fill #0

## 2024-08-07 ENCOUNTER — Ambulatory Visit: Admitting: Family

## 2024-08-07 ENCOUNTER — Other Ambulatory Visit: Payer: Self-pay

## 2024-08-07 ENCOUNTER — Encounter: Payer: Self-pay | Admitting: Family

## 2024-08-07 ENCOUNTER — Other Ambulatory Visit (HOSPITAL_COMMUNITY): Payer: Self-pay

## 2024-08-07 VITALS — BP 103/68 | HR 80 | Temp 98.1°F | Ht 61.0 in | Wt 165.0 lb

## 2024-08-07 DIAGNOSIS — E785 Hyperlipidemia, unspecified: Secondary | ICD-10-CM | POA: Diagnosis not present

## 2024-08-07 DIAGNOSIS — Z0000 Encounter for general adult medical examination without abnormal findings: Secondary | ICD-10-CM

## 2024-08-07 DIAGNOSIS — E119 Type 2 diabetes mellitus without complications: Secondary | ICD-10-CM

## 2024-08-07 DIAGNOSIS — Z23 Encounter for immunization: Secondary | ICD-10-CM | POA: Diagnosis not present

## 2024-08-07 DIAGNOSIS — Z7985 Long-term (current) use of injectable non-insulin antidiabetic drugs: Secondary | ICD-10-CM | POA: Diagnosis not present

## 2024-08-07 DIAGNOSIS — I10 Essential (primary) hypertension: Secondary | ICD-10-CM

## 2024-08-07 LAB — COMPREHENSIVE METABOLIC PANEL WITH GFR
ALT: 98 U/L — ABNORMAL HIGH (ref 0–35)
AST: 100 U/L — ABNORMAL HIGH (ref 0–37)
Albumin: 4 g/dL (ref 3.5–5.2)
Alkaline Phosphatase: 126 U/L — ABNORMAL HIGH (ref 39–117)
BUN: 12 mg/dL (ref 6–23)
CO2: 31 meq/L (ref 19–32)
Calcium: 9.3 mg/dL (ref 8.4–10.5)
Chloride: 97 meq/L (ref 96–112)
Creatinine, Ser: 0.56 mg/dL (ref 0.40–1.20)
GFR: 111.55 mL/min (ref >60.00)
Glucose, Bld: 88 mg/dL (ref 70–99)
Potassium: 3.9 meq/L (ref 3.5–5.1)
Sodium: 139 meq/L (ref 135–145)
Total Bilirubin: 0.5 mg/dL (ref 0.2–1.2)
Total Protein: 7.5 g/dL (ref 6.0–8.3)

## 2024-08-07 LAB — LIPID PANEL
Cholesterol: 157 mg/dL (ref 0–200)
HDL: 40.2 mg/dL (ref >39.00)
LDL Cholesterol: 72 mg/dL (ref 0–99)
NonHDL: 116.85
Total CHOL/HDL Ratio: 4
Triglycerides: 225 mg/dL — ABNORMAL HIGH (ref 0.0–149.0)
VLDL: 45 mg/dL — ABNORMAL HIGH (ref 0.0–40.0)

## 2024-08-07 LAB — MICROALBUMIN / CREATININE URINE RATIO
Creatinine,U: 92.2 mg/dL
Microalb Creat Ratio: 11.4 mg/g (ref 0.0–30.0)
Microalb, Ur: 1.1 mg/dL (ref 0.0–1.9)

## 2024-08-07 LAB — HEMOGLOBIN A1C: Hgb A1c MFr Bld: 6.7 % — ABNORMAL HIGH (ref 4.6–6.5)

## 2024-08-07 MED ORDER — TIRZEPATIDE 2.5 MG/0.5ML ~~LOC~~ SOAJ
2.5000 mg | SUBCUTANEOUS | 0 refills | Status: AC
  Filled 2024-08-07: qty 2, 28d supply, fill #0

## 2024-08-07 NOTE — Progress Notes (Signed)
 Subjective:     Patient ID: Alyssa Berry, female    DOB: 1981/01/11, 43 y.o.   MRN: [id number]  Chief Complaint  Patient presents with   Annual Exam    Annual exam    HPI  Discussed the use of AI scribe software for clinical note transcription with the patient, who gave verbal consent to proceed.  History of Present Illness Alyssa Berry is a 43 year old female who presents for an annual physical exam.  Her diet and exercise routine has been inconsistent due to the stress of her father's passing in August, who had been declining from dementia over the past four years. This has led to increased carbohydrate intake, and she is concerned about her A1c levels.  She is currently taking hydrochlorothiazide , carvedilol , and Diovan  for hypertension. Her blood pressure readings at home range from 100-120 systolic and 70-75 diastolic. She monitors her blood pressure regularly and notes that stress affects her readings.  She is on Lipitor and 600 mg of fish oil for cholesterol management. Her last A1c was 6.6, and she is taking metformin . She checks her blood sugar twice daily, with morning readings between 80-100 and evening readings between 100-130, depending on her diet.  She experiences occasional migraines, managed with Excedrin, as previous medications like Maxalt and Imitrex  caused excessive drowsiness, and Nurtec was not covered by insurance.  Her granddaughter was born prematurely at 28 weeks due to her daughter's preeclampsia, but is now gaining weight well on Enfamil formula after initial feeding issues with Similac.   Immunizations: declines HPV, Prevnar 20 today.  Diet: needs improvement Exercise: needs improvement Pap Smear:up to date Mammogram: 8/25 Vision: up to date Dental: up to date      Health Maintenance Due  Topic Date Due   Diabetic kidney evaluation - Urine ACR  Never done   OPHTHALMOLOGY EXAM  09/04/2022   COVID-19 Vaccine (4 - 2025-26  season) 05/04/2024   HEMOGLOBIN A1C  06/13/2024    Past Medical History:  Diagnosis Date   Arthritis    Borderline diabetes    Fatty liver    noted on US  7/24   Hypertension    Migraine    Primary biliary cirrhosis (HCC)    PVC (premature ventricular contraction)     Past Surgical History:  Procedure Laterality Date   TUBAL LIGATION  2008    Family History  Problem Relation Age of Onset   Hyperlipidemia Mother    Hypertension Mother    Dementia Father    Hypertension Sister    Hypertension Brother    Cancer Maternal Grandmother        lung   Hyperlipidemia Maternal Grandmother    Hypertension Maternal Grandmother    Hypertension Maternal Grandfather        died from sepsis   Depression Daughter    Colon cancer Neg Hx    Esophageal cancer Neg Hx    Inflammatory bowel disease Neg Hx    Liver disease Neg Hx    Pancreatic cancer Neg Hx    Rectal cancer Neg Hx    Stomach cancer Neg Hx     Social History   Socioeconomic History   Marital status: Married    Spouse name: Not on file   Number of children: 3   Years of education: Not on file   Highest education level: Associate degree: occupational, scientist, product/process development, or vocational program  Occupational History   Occupation: CMA  Tobacco Use   Smoking status: Never  Smokeless tobacco: Never  Vaping Use   Vaping status: Never Used  Substance and Sexual Activity   Alcohol use: No    Alcohol/week: 0.0 standard drinks of alcohol   Drug use: No   Sexual activity: Not on file    Comment: tubal ligation  Other Topics Concern   Not on file  Social History Narrative   Married   3 Children   2002- daughter Jacqulynn   2004- Cathryn   2008GLENWOOD Faden   CMA at [employer]   Completed associates degree   Enjoys drawing   One Granddaughter born 11/25   Social Drivers of Health   Financial Resource Strain: Low Risk  (08/06/2024)   Overall Financial Resource Strain (CARDIA)    Difficulty of Paying Living Expenses: Not very  hard  Food Insecurity: Food Insecurity Present (08/06/2024)   Hunger Vital Sign    Worried About Running Out of Food in the Last Year: Sometimes true    Ran Out of Food in the Last Year: Sometimes true  Transportation Needs: No Transportation Needs (08/06/2024)   PRAPARE - Administrator, Civil Service (Medical): No    Lack of Transportation (Non-Medical): No  Physical Activity: Insufficiently Active (08/06/2024)   Exercise Vital Sign    Days of Exercise per Week: 3 days    Minutes of Exercise per Session: 30 min  Stress: Stress Concern Present (08/06/2024)   [employer] of Occupational Health - Occupational Stress Questionnaire    Feeling of Stress: To some extent  Social Connections: Moderately Integrated (08/06/2024)   Social Connection and Isolation Panel    Frequency of Communication with Friends and Family: More than three times a week    Frequency of Social Gatherings with Friends and Family: More than three times a week    Attends Religious Services: 1 to 4 times per year    Active Member of [employer] or Organizations: No    Attends Engineer, Structural: Not on file    Marital Status: Married  Catering Manager Violence: Not on file    Outpatient Medications Prior to Visit  Medication Sig Dispense Refill   atorvastatin  (LIPITOR) 40 MG tablet Take 1 tablet (40 mg total) by mouth daily. 90 tablet 1   carvedilol  (COREG ) 12.5 MG tablet Take 1 tablet (12.5 mg total) by mouth 2 (two) times daily with a meal. 180 tablet 0   hydrochlorothiazide  (HYDRODIURIL ) 25 MG tablet Take 1 tablet (25 mg total) by mouth daily. 90 tablet 0   LO LOESTRIN FE  1 MG-10 MCG / 10 MCG tablet Take 1 tablet by mouth daily. 84 tablet 5   metFORMIN  (GLUCOPHAGE ) 500 MG tablet Take 1 tablet (500 mg) by mouth 2 times daily with a meal. 180 tablet 0   Multiple Vitamins-Minerals (MULTIVITAL) tablet Take 1 tablet by mouth daily. WITH POTASSIUM     valsartan  (DIOVAN ) 320 MG tablet Take 1 tablet  (320 mg total) by mouth daily. 90 tablet 1   Blood Glucose Monitoring Suppl DEVI 1 each by Does not apply route in the morning, at noon, and at bedtime. May substitute to any manufacturer covered by patient's insurance. 1 each 0   LO LOESTRIN FE  1 MG-10 MCG / 10 MCG tablet Take 1 tablet by mouth daily. 84 tablet 4   No facility-administered medications prior to visit.    No Known Allergies  Review of Systems  Constitutional:  Positive for weight loss.  HENT:  Negative for congestion and hearing loss.  Eyes:  Negative for blurred vision.  Respiratory:  Negative for cough.   Cardiovascular:  Negative for leg swelling.  Gastrointestinal:  Negative for constipation and diarrhea.  Genitourinary:  Negative for dysuria and frequency.  Musculoskeletal:  Negative for joint pain and myalgias.  Skin:  Negative for rash.  Neurological:  Positive for headaches (occasional migraines, generally relieved with excedrin).       Objective:    Physical Exam   BP 103/68 (BP Location: Right Arm, Patient Position: Sitting, Cuff Size: Normal)   Pulse 80   Temp 98.1 F (36.7 C) (Oral)   Ht 5' 1 (1.549 m)   Wt 165 lb (74.8 kg)   SpO2 98%   BMI 31.18 kg/m  Wt Readings from Last 3 Encounters:  08/07/24 165 lb (74.8 kg)  12/13/23 170 lb (77.1 kg)  06/21/23 164 lb 8 oz (74.6 kg)   Physical Exam  Constitutional: She is oriented to person, place, and time. She appears well-developed and well-nourished. No distress.  HENT:  Head: Normocephalic and atraumatic.  Right Ear: Tympanic membrane and ear canal normal.  Left Ear: Tympanic membrane and ear canal normal.  Mouth/Throat: Oropharynx is clear and moist.  Eyes: Pupils are equal, round, and reactive to light. No scleral icterus.  Neck: Normal range of motion. No thyromegaly present.  Cardiovascular: Normal rate and regular rhythm.   No murmur heard. Pulmonary/Chest: Effort normal and breath sounds normal. No respiratory distress. He has no  wheezes. She has no rales. She exhibits no tenderness.  Abdominal: Soft. Bowel sounds are normal. She exhibits no distension and no mass. There is no tenderness. There is no rebound and no guarding.  Musculoskeletal: She exhibits no edema.  Lymphadenopathy:    She has no cervical adenopathy.  Neurological: She is alert and oriented to person, place, and time. She has normal patellar reflexes. She exhibits normal muscle tone. Coordination normal.  Skin: Skin is warm and dry.  Psychiatric: She has a normal mood and affect. Her behavior is normal. Judgment and thought content normal.  Breast/Pelvic: deferred to GYN Diabetic Foot Exam - Simple   Simple Foot Form Diabetic Foot exam was performed with the following findings: Yes 08/07/2024 10:38 AM  Visual Inspection No deformities, no ulcerations, no other skin breakdown bilaterally: Yes Sensation Testing Intact to touch and monofilament testing bilaterally: Yes Pulse Check Posterior Tibialis and Dorsalis pulse intact bilaterally: Yes Comments          Assessment & Plan:       Assessment & Plan:   Problem List Items Addressed This Visit       Unprioritized   Primary hypertension   BP Readings from Last 3 Encounters:  08/07/24 103/68  12/13/23 117/76  06/21/23 (!) 124/90   Blood pressure well-controlled. Discussed potential reduction of hydrochlorothiazide  if readings remain low. - Continue current antihypertensive regimen. - Monitor blood pressure at home and report if consistently low (e.g., 100/50 mmHg).        Preventative health care - Primary   Routine wellness visit with emphasis on diet and exercise improvement. No interest in HPV vaccine or HIV screening. Occasional migraines managed with Excedrin. - Continue routine wellness visits. - Encouraged improvement in diet and exercise. - Continue current Pap smear and mammogram intervals. - No HPV vaccine at this time. - Continue routine vision and dental exams.       Hyperlipidemia   Lab Results  Component Value Date   CHOL 158 12/13/2023   HDL 43.50 12/13/2023  LDLCALC 65 12/13/2023   LDLDIRECT 66.0 11/22/2022   TRIG 249.0 (H) 12/13/2023   CHOLHDL 4 12/13/2023    On atorvastatin  and fish oil. Previous high triglycerides. - Ordered lipid panel and triglyceride test.      Relevant Orders   Lipid panel   Diabetes type 2, controlled (HCC)   Lab Results  Component Value Date   HGBA1C 6.6 (H) 12/13/2023   HGBA1C 6.4 (H) 02/22/2023   HGBA1C 6.4 11/22/2022   Lab Results  Component Value Date   LDLCALC 65 12/13/2023   CREATININE 0.61 12/13/2023  Generally well controlled at home on metformin .  A1c at 6.6%. Blood sugar levels 80-130 mg/dL. Discussed GLP-1 receptor agonist for weight loss and A1c improvement. Mounjaro  preferred for weight loss, potential benefits for fatty liver disease and sleep apnea. - Administered Prevnar 20 vaccine. - Ordered urine microalbumin test. - Performed foot exam. - Prescribed Mounjaro  2.5 mg weekly for weight loss and A1c improvement. - Instructed to update in 3.5 weeks on weight loss and side effects. - Advised to inform eye exam provider of diabetes diagnosis for retinopathy screening.       Relevant Medications   tirzepatide  (MOUNJARO ) 2.5 MG/0.5ML Pen   Other Relevant Orders   HgB A1c   Comp Met (CMET)   Urine Microalbumin w/creat. ratio   Other Visit Diagnoses       Need for pneumococcal 20-valent conjugate vaccination       Relevant Orders   Pneumococcal conjugate vaccine 20-valent (Prevnar 20) (Completed)      Assessment & Plan   Assessment & Plan    I am having Alyssa Berry start on tirzepatide . I am also having her maintain her Multivital, Lo Loestrin Fe , valsartan , atorvastatin , Blood Glucose Monitoring Suppl, Lo Loestrin Fe , carvedilol , hydrochlorothiazide , and metFORMIN .  Meds ordered this encounter  Medications   tirzepatide  (MOUNJARO ) 2.5 MG/0.5ML Pen    Sig:  Inject 2.5 mg into the skin once a week.    Dispense:  2 mL    Refill:  0    Supervising Provider:   DOMENICA BLACKBIRD A [[id number]]

## 2024-08-07 NOTE — Patient Instructions (Signed)
  VISIT SUMMARY: Today, you had your annual physical exam. We discussed your diet and exercise routine, which has been inconsistent due to the stress of your father's passing. We reviewed your current medications and health conditions, including hypertension, diabetes, cholesterol management, and migraines. We also talked about your granddaughter's health. Several tests and a new medication were prescribed to help manage your conditions.  YOUR PLAN: -ADULT WELLNESS VISIT: This visit focused on your overall health and wellness. We emphasized the importance of improving your diet and exercise routine. You should continue with your regular Pap smears, mammograms, vision, and dental exams. You can continue using Excedrin for your occasional migraines as needed.  -TYPE 2 DIABETES MELLITUS: Type 2 diabetes is a condition where your body does not use insulin properly, leading to high blood sugar levels. Your A1c is currently 6.6%. We discussed starting a new medication, Mounjaro , to help with weight loss and improve your A1c levels. You received the Prevnar 20 vaccine, and we ordered a urine microalbumin test and performed a foot exam. Please update us  in 3.5 weeks on your weight loss and any side effects. Inform your eye exam provider about your diabetes for retinopathy screening.  -PRIMARY HYPERTENSION: Hypertension is high blood pressure. Your blood pressure is well-controlled with your current medications. We discussed the possibility of reducing your hydrochlorothiazide  if your readings remain low. Continue to monitor your blood pressure at home and report if it consistently reads low (e.g., 100/50 mmHg).  -HYPERLIPIDEMIA: Hyperlipidemia is having high levels of fats (lipids) in your blood, such as cholesterol and triglycerides. You are currently taking atorvastatin  and fish oil. We ordered a lipid panel and triglyceride test to monitor your levels.  -MIGRAINE: Migraines are severe headaches that can cause  throbbing pain, usually on one side of the head. You experience occasional migraines, which you manage with Excedrin. Continue using Excedrin as needed.  -HISTORY OF FATTY LIVER DISEASE: Fatty liver disease is a condition where fat builds up in your liver. We discussed that the new medication, Mounjaro , may also benefit your fatty liver disease.  INSTRUCTIONS: Please follow up in 3.5 weeks to update us  on your weight loss and any side effects from Mounjaro . Continue monitoring your blood pressure at home and report if it consistently reads low. Inform your eye exam provider about your diabetes for retinopathy screening.

## 2024-08-07 NOTE — Assessment & Plan Note (Signed)
 Did not tolerate imitrex  or maxalt, nurtec was not covered.  Managing with prn excedrin.

## 2024-08-07 NOTE — Assessment & Plan Note (Addendum)
 Lab Results  Component Value Date   CHOL 158 12/13/2023   HDL 43.50 12/13/2023   LDLCALC 65 12/13/2023   LDLDIRECT 66.0 11/22/2022   TRIG 249.0 (H) 12/13/2023   CHOLHDL 4 12/13/2023    On atorvastatin  and fish oil. Previous high triglycerides. - Ordered lipid panel and triglyceride test.

## 2024-08-07 NOTE — Assessment & Plan Note (Signed)
 Routine wellness visit with emphasis on diet and exercise improvement. No interest in HPV vaccine or HIV screening. Occasional migraines managed with Excedrin. - Continue routine wellness visits. - Encouraged improvement in diet and exercise. - Continue current Pap smear and mammogram intervals. - No HPV vaccine at this time. - Continue routine vision and dental exams.

## 2024-08-07 NOTE — Assessment & Plan Note (Addendum)
 Lab Results  Component Value Date   HGBA1C 6.6 (H) 12/13/2023   HGBA1C 6.4 (H) 02/22/2023   HGBA1C 6.4 11/22/2022   Lab Results  Component Value Date   LDLCALC 65 12/13/2023   CREATININE 0.61 12/13/2023  Generally well controlled at home on metformin .  A1c at 6.6%. Blood sugar levels 80-130 mg/dL. Discussed GLP-1 receptor agonist for weight loss and A1c improvement. Mounjaro  preferred for weight loss, potential benefits for fatty liver disease and sleep apnea. - Administered Prevnar 20 vaccine. - Ordered urine microalbumin test. - Performed foot exam. - Prescribed Mounjaro  2.5 mg weekly for weight loss and A1c improvement. - Instructed to update in 3.5 weeks on weight loss and side effects. - Advised to inform eye exam provider of diabetes diagnosis for retinopathy screening.

## 2024-08-07 NOTE — Assessment & Plan Note (Addendum)
 BP Readings from Last 3 Encounters:  08/07/24 103/68  12/13/23 117/76  06/21/23 (!) 124/90   Blood pressure well-controlled. Discussed potential reduction of hydrochlorothiazide  if readings remain low. - Continue current antihypertensive regimen. - Monitor blood pressure at home and report if consistently low (e.g., 100/50 mmHg).

## 2024-08-07 NOTE — Assessment & Plan Note (Signed)
  Discussed potential benefits of GLP-1 receptor agonist for fatty liver disease. - Prescribed Mounjaro  for potential benefits on fatty liver disease.

## 2024-08-09 ENCOUNTER — Ambulatory Visit: Payer: Self-pay | Admitting: Family

## 2024-08-09 DIAGNOSIS — R7989 Other specified abnormal findings of blood chemistry: Secondary | ICD-10-CM

## 2024-08-10 NOTE — Telephone Encounter (Signed)
-----   Message from [facility] sent at 08/10/2024  1:29 PM EST ----- MOS, Thank you for keeping me up-to-date we have not seen her in a while. I agree it is reasonable to see if weight loss and GLP may be helpful in regards to the liver biochemical testing improving. I do recommend that she get an updated abdominal ultrasound to look at the liver and look at the spleen; if you could order that that would be great, or you can let me know and my team can work on  that. I will have my team work on having her come into clinic.  We had discussed that if her liver test continue to be elevated, we may need to get a liver biopsy to be performed to evaluate things  further. In any case I do think it still reasonable to move forward with your treatment as you are outlining. Thanks. GM  Patty, This patient needs to be seen in clinic by APP or myself in the next 2 to 3 months. Follow-up PBC and abnormal LFTs. Thanks. GM ----- Message ----- From: Daryl Setter, NP Sent: 08/09/2024  10:56 AM EST To: Aloha Wilhelmenia Raddle., MD  Dr. Wilhelmenia,   FYI- her LFT's has risen since last check.  Her A1C is now in the diabetes range so I am going to try to get Mounjaro  approved for her which I hope will help with weight loss.  Thanks,  Elta Angell  ----- Message ----- From: Interface, [facility] Sent: 08/07/2024   2:19 PM EST To: Setter Daryl, NP

## 2024-08-21 ENCOUNTER — Ambulatory Visit (HOSPITAL_BASED_OUTPATIENT_CLINIC_OR_DEPARTMENT_OTHER)
Admission: RE | Admit: 2024-08-21 | Discharge: 2024-08-21 | Disposition: A | Discharge status: Home / Self Care | Source: Ambulatory Visit | Attending: Family | Admitting: Family

## 2024-08-21 DIAGNOSIS — R7989 Other specified abnormal findings of blood chemistry: Secondary | ICD-10-CM | POA: Insufficient documentation

## 2024-08-21 DIAGNOSIS — K760 Fatty (change of) liver, not elsewhere classified: Secondary | ICD-10-CM | POA: Diagnosis not present

## 2024-08-24 ENCOUNTER — Other Ambulatory Visit (HOSPITAL_COMMUNITY): Payer: Self-pay

## 2024-08-24 ENCOUNTER — Other Ambulatory Visit: Payer: Self-pay | Admitting: Family

## 2024-08-24 ENCOUNTER — Encounter: Payer: Self-pay | Admitting: Family

## 2024-08-24 DIAGNOSIS — E119 Type 2 diabetes mellitus without complications: Secondary | ICD-10-CM

## 2024-08-24 MED ORDER — TIRZEPATIDE 2.5 MG/0.5ML ~~LOC~~ SOAJ
2.5000 mg | SUBCUTANEOUS | 2 refills | Status: DC
  Filled 2024-08-24 – 2024-08-30 (×2): qty 2, 28d supply, fill #0

## 2024-08-28 ENCOUNTER — Ambulatory Visit: Payer: Self-pay | Admitting: Family

## 2024-08-30 ENCOUNTER — Other Ambulatory Visit (HOSPITAL_COMMUNITY): Payer: Self-pay

## 2024-09-21 ENCOUNTER — Other Ambulatory Visit (HOSPITAL_COMMUNITY): Payer: Self-pay

## 2024-09-21 ENCOUNTER — Other Ambulatory Visit: Payer: Self-pay | Admitting: Family

## 2024-09-21 ENCOUNTER — Encounter (HOSPITAL_COMMUNITY): Payer: Self-pay

## 2024-09-21 ENCOUNTER — Encounter: Payer: Self-pay | Admitting: Family

## 2024-09-21 ENCOUNTER — Other Ambulatory Visit: Payer: Self-pay

## 2024-09-21 DIAGNOSIS — E785 Hyperlipidemia, unspecified: Secondary | ICD-10-CM

## 2024-09-21 DIAGNOSIS — I10 Essential (primary) hypertension: Secondary | ICD-10-CM

## 2024-09-21 DIAGNOSIS — E119 Type 2 diabetes mellitus without complications: Secondary | ICD-10-CM

## 2024-09-21 MED ORDER — TIRZEPATIDE 5 MG/0.5ML ~~LOC~~ SOAJ
5.0000 mg | SUBCUTANEOUS | 0 refills | Status: AC
  Filled 2024-09-21 (×2): qty 6, 84d supply, fill #0
  Filled 2024-09-21 – 2024-09-22 (×2): qty 2, 28d supply, fill #0

## 2024-09-21 MED ORDER — ATORVASTATIN CALCIUM 40 MG PO TABS
40.0000 mg | ORAL_TABLET | Freq: Every day | ORAL | 1 refills | Status: AC
  Filled 2024-09-21: qty 90, 90d supply, fill #0

## 2024-09-21 MED ORDER — VALSARTAN 320 MG PO TABS
320.0000 mg | ORAL_TABLET | Freq: Every day | ORAL | 1 refills | Status: AC
  Filled 2024-09-21: qty 90, 90d supply, fill #0

## 2024-09-22 ENCOUNTER — Other Ambulatory Visit: Payer: Self-pay

## 2024-09-22 ENCOUNTER — Other Ambulatory Visit (HOSPITAL_COMMUNITY): Payer: Self-pay

## 2024-10-16 ENCOUNTER — Ambulatory Visit: Admitting: Gastroenterology

## 2024-11-20 ENCOUNTER — Ambulatory Visit: Admitting: Family
